# Patient Record
Sex: Female | Born: 1940 | Race: White | Hispanic: No | Marital: Married | Smoking: Never smoker
Health system: Southern US, Community
[De-identification: ages and names within clinical notes are randomized; demographics above are authoritative.]

## PROBLEM LIST (undated history)

## (undated) DIAGNOSIS — I10 Essential (primary) hypertension: Secondary | ICD-10-CM

## (undated) DIAGNOSIS — F419 Anxiety disorder, unspecified: Secondary | ICD-10-CM

## (undated) DIAGNOSIS — E079 Disorder of thyroid, unspecified: Secondary | ICD-10-CM

## (undated) HISTORY — PX: ABDOMINAL HYSTERECTOMY: SHX81

## (undated) HISTORY — PX: THYROIDECTOMY: SHX17

---

## 2009-03-02 ENCOUNTER — Ambulatory Visit: Payer: Self-pay | Admitting: Diagnostic Radiology

## 2009-03-02 ENCOUNTER — Ambulatory Visit (HOSPITAL_BASED_OUTPATIENT_CLINIC_OR_DEPARTMENT_OTHER): Admission: RE | Admit: 2009-03-02 | Discharge: 2009-03-02 | Payer: Self-pay | Admitting: Family Medicine

## 2009-03-18 ENCOUNTER — Ambulatory Visit (HOSPITAL_BASED_OUTPATIENT_CLINIC_OR_DEPARTMENT_OTHER): Admission: RE | Admit: 2009-03-18 | Discharge: 2009-03-18 | Payer: Self-pay | Admitting: Family Medicine

## 2009-03-18 ENCOUNTER — Ambulatory Visit: Payer: Self-pay | Admitting: Radiology

## 2009-04-15 ENCOUNTER — Ambulatory Visit (HOSPITAL_BASED_OUTPATIENT_CLINIC_OR_DEPARTMENT_OTHER): Admission: RE | Admit: 2009-04-15 | Discharge: 2009-04-15 | Payer: Self-pay | Admitting: Family Medicine

## 2009-04-15 ENCOUNTER — Ambulatory Visit: Payer: Self-pay | Admitting: Diagnostic Radiology

## 2014-08-27 DIAGNOSIS — Z79899 Other long term (current) drug therapy: Secondary | ICD-10-CM | POA: Insufficient documentation

## 2014-08-27 DIAGNOSIS — G4700 Insomnia, unspecified: Secondary | ICD-10-CM | POA: Insufficient documentation

## 2014-08-27 DIAGNOSIS — E785 Hyperlipidemia, unspecified: Secondary | ICD-10-CM | POA: Insufficient documentation

## 2014-08-27 DIAGNOSIS — M79604 Pain in right leg: Secondary | ICD-10-CM | POA: Insufficient documentation

## 2014-08-27 DIAGNOSIS — F339 Major depressive disorder, recurrent, unspecified: Secondary | ICD-10-CM | POA: Insufficient documentation

## 2014-08-27 DIAGNOSIS — F32A Depression, unspecified: Secondary | ICD-10-CM | POA: Insufficient documentation

## 2014-08-27 DIAGNOSIS — E782 Mixed hyperlipidemia: Secondary | ICD-10-CM | POA: Insufficient documentation

## 2015-03-10 DIAGNOSIS — R55 Syncope and collapse: Secondary | ICD-10-CM | POA: Insufficient documentation

## 2015-03-10 DIAGNOSIS — I4510 Unspecified right bundle-branch block: Secondary | ICD-10-CM | POA: Insufficient documentation

## 2015-10-06 DIAGNOSIS — Z23 Encounter for immunization: Secondary | ICD-10-CM | POA: Insufficient documentation

## 2016-06-14 DIAGNOSIS — Z08 Encounter for follow-up examination after completed treatment for malignant neoplasm: Secondary | ICD-10-CM | POA: Insufficient documentation

## 2016-06-14 DIAGNOSIS — R32 Unspecified urinary incontinence: Secondary | ICD-10-CM | POA: Insufficient documentation

## 2019-06-09 DIAGNOSIS — J301 Allergic rhinitis due to pollen: Secondary | ICD-10-CM | POA: Insufficient documentation

## 2019-07-28 ENCOUNTER — Emergency Department (HOSPITAL_BASED_OUTPATIENT_CLINIC_OR_DEPARTMENT_OTHER): Payer: Medicare Other

## 2019-07-28 ENCOUNTER — Inpatient Hospital Stay (HOSPITAL_BASED_OUTPATIENT_CLINIC_OR_DEPARTMENT_OTHER): Payer: Medicare Other

## 2019-07-28 ENCOUNTER — Other Ambulatory Visit: Payer: Self-pay

## 2019-07-28 ENCOUNTER — Encounter (HOSPITAL_BASED_OUTPATIENT_CLINIC_OR_DEPARTMENT_OTHER): Payer: Self-pay | Admitting: *Deleted

## 2019-07-28 ENCOUNTER — Inpatient Hospital Stay (HOSPITAL_BASED_OUTPATIENT_CLINIC_OR_DEPARTMENT_OTHER)
Admission: EM | Admit: 2019-07-28 | Discharge: 2019-07-31 | DRG: 351 | Disposition: A | Discharge status: Home / Self Care | Payer: Medicare Other | Attending: Family Medicine | Admitting: Family Medicine

## 2019-07-28 DIAGNOSIS — F329 Major depressive disorder, single episode, unspecified: Secondary | ICD-10-CM | POA: Diagnosis not present

## 2019-07-28 DIAGNOSIS — N3289 Other specified disorders of bladder: Secondary | ICD-10-CM

## 2019-07-28 DIAGNOSIS — Z79890 Hormone replacement therapy (postmenopausal): Secondary | ICD-10-CM | POA: Diagnosis not present

## 2019-07-28 DIAGNOSIS — K4090 Unilateral inguinal hernia, without obstruction or gangrene, not specified as recurrent: Secondary | ICD-10-CM | POA: Diagnosis present

## 2019-07-28 DIAGNOSIS — E785 Hyperlipidemia, unspecified: Secondary | ICD-10-CM | POA: Diagnosis not present

## 2019-07-28 DIAGNOSIS — E890 Postprocedural hypothyroidism: Secondary | ICD-10-CM | POA: Diagnosis present

## 2019-07-28 DIAGNOSIS — Z8249 Family history of ischemic heart disease and other diseases of the circulatory system: Secondary | ICD-10-CM | POA: Diagnosis not present

## 2019-07-28 DIAGNOSIS — Z90710 Acquired absence of both cervix and uterus: Secondary | ICD-10-CM | POA: Diagnosis not present

## 2019-07-28 DIAGNOSIS — K56609 Unspecified intestinal obstruction, unspecified as to partial versus complete obstruction: Secondary | ICD-10-CM | POA: Diagnosis present

## 2019-07-28 DIAGNOSIS — D72829 Elevated white blood cell count, unspecified: Secondary | ICD-10-CM

## 2019-07-28 DIAGNOSIS — N1330 Unspecified hydronephrosis: Secondary | ICD-10-CM | POA: Diagnosis present

## 2019-07-28 DIAGNOSIS — K4190 Unilateral femoral hernia, without obstruction or gangrene, not specified as recurrent: Secondary | ICD-10-CM | POA: Diagnosis present

## 2019-07-28 DIAGNOSIS — Z681 Body mass index (BMI) 19 or less, adult: Secondary | ICD-10-CM

## 2019-07-28 DIAGNOSIS — E440 Moderate protein-calorie malnutrition: Secondary | ICD-10-CM | POA: Insufficient documentation

## 2019-07-28 DIAGNOSIS — E039 Hypothyroidism, unspecified: Secondary | ICD-10-CM | POA: Diagnosis present

## 2019-07-28 DIAGNOSIS — Z20828 Contact with and (suspected) exposure to other viral communicable diseases: Secondary | ICD-10-CM | POA: Diagnosis present

## 2019-07-28 DIAGNOSIS — E876 Hypokalemia: Secondary | ICD-10-CM | POA: Diagnosis not present

## 2019-07-28 DIAGNOSIS — I10 Essential (primary) hypertension: Secondary | ICD-10-CM | POA: Diagnosis present

## 2019-07-28 DIAGNOSIS — Z79899 Other long term (current) drug therapy: Secondary | ICD-10-CM

## 2019-07-28 DIAGNOSIS — F419 Anxiety disorder, unspecified: Secondary | ICD-10-CM | POA: Diagnosis present

## 2019-07-28 DIAGNOSIS — K4130 Unilateral femoral hernia, with obstruction, without gangrene, not specified as recurrent: Principal | ICD-10-CM | POA: Diagnosis present

## 2019-07-28 HISTORY — DX: Anxiety disorder, unspecified: F41.9

## 2019-07-28 HISTORY — DX: Essential (primary) hypertension: I10

## 2019-07-28 HISTORY — DX: Disorder of thyroid, unspecified: E07.9

## 2019-07-28 LAB — COMPREHENSIVE METABOLIC PANEL
ALT: 21 U/L (ref 0–44)
AST: 30 U/L (ref 15–41)
Albumin: 4.4 g/dL (ref 3.5–5.0)
Alkaline Phosphatase: 103 U/L (ref 38–126)
Anion gap: 13 (ref 5–15)
BUN: 21 mg/dL (ref 8–23)
CO2: 27 mmol/L (ref 22–32)
Calcium: 9.9 mg/dL (ref 8.9–10.3)
Chloride: 96 mmol/L — ABNORMAL LOW (ref 98–111)
Creatinine, Ser: 0.68 mg/dL (ref 0.44–1.00)
GFR calc Af Amer: >60 mL/min (ref >60)
GFR calc non Af Amer: >60 mL/min (ref >60)
Glucose, Bld: 129 mg/dL — ABNORMAL HIGH (ref 70–99)
Potassium: 4.3 mmol/L (ref 3.5–5.1)
Sodium: 136 mmol/L (ref 135–145)
Total Bilirubin: 0.9 mg/dL (ref 0.3–1.2)
Total Protein: 8.5 g/dL — ABNORMAL HIGH (ref 6.5–8.1)

## 2019-07-28 LAB — CBC
HCT: 46.6 % — ABNORMAL HIGH (ref 36.0–46.0)
Hemoglobin: 15.2 g/dL — ABNORMAL HIGH (ref 12.0–15.0)
MCH: 29.6 pg (ref 26.0–34.0)
MCHC: 32.6 g/dL (ref 30.0–36.0)
MCV: 90.7 fL (ref 80.0–100.0)
Platelets: 298 10*3/uL (ref 150–400)
RBC: 5.14 MIL/uL — ABNORMAL HIGH (ref 3.87–5.11)
RDW: 13.2 % (ref 11.5–15.5)
WBC: 11.8 10*3/uL — ABNORMAL HIGH (ref 4.0–10.5)
nRBC: 0 % (ref 0.0–0.2)

## 2019-07-28 LAB — LIPASE, BLOOD: Lipase: 34 U/L (ref 11–51)

## 2019-07-28 MED ORDER — MORPHINE SULFATE (PF) 4 MG/ML IV SOLN
4.0000 mg | Freq: Once | INTRAVENOUS | Status: AC
  Administered 2019-07-28: 4 mg via INTRAVENOUS
  Filled 2019-07-28: qty 1

## 2019-07-28 MED ORDER — BENZOCAINE 20 % MT AERO
INHALATION_SPRAY | Freq: Once | OROMUCOSAL | Status: AC
  Administered 2019-07-28: via OROMUCOSAL

## 2019-07-28 MED ORDER — SODIUM CHLORIDE 0.9% FLUSH
3.0000 mL | Freq: Once | INTRAVENOUS | Status: DC
  Filled 2019-07-28: qty 3

## 2019-07-28 MED ORDER — IOHEXOL 300 MG/ML  SOLN
100.0000 mL | Freq: Once | INTRAMUSCULAR | Status: AC | PRN
  Administered 2019-07-28: 100 mL via INTRAVENOUS

## 2019-07-28 MED ORDER — SODIUM CHLORIDE 0.9 % IV BOLUS
1000.0000 mL | Freq: Once | INTRAVENOUS | Status: AC
  Administered 2019-07-28: 1000 mL via INTRAVENOUS

## 2019-07-28 MED ORDER — BENZOCAINE 20 % MT AERO
INHALATION_SPRAY | OROMUCOSAL | Status: AC
  Filled 2019-07-28: qty 57

## 2019-07-28 MED ORDER — LIDOCAINE VISCOUS HCL 2 % MT SOLN
15.0000 mL | Freq: Once | OROMUCOSAL | Status: AC
  Administered 2019-07-28: 15 mL via OROMUCOSAL
  Filled 2019-07-28: qty 15

## 2019-07-28 MED ORDER — ONDANSETRON 4 MG PO TBDP
4.0000 mg | ORAL_TABLET | Freq: Once | ORAL | Status: AC
  Administered 2019-07-28: 4 mg via ORAL
  Filled 2019-07-28: qty 1

## 2019-07-28 MED ORDER — ONDANSETRON HCL 4 MG/2ML IJ SOLN
4.0000 mg | Freq: Once | INTRAMUSCULAR | Status: AC
  Administered 2019-07-28: 4 mg via INTRAVENOUS
  Filled 2019-07-28: qty 2

## 2019-07-28 NOTE — ED Notes (Signed)
[facility] notified (Leslie Fuller) - patient ready for transport 

## 2019-07-28 NOTE — ED Triage Notes (Signed)
Vomiting since yesterday. Gas. Constipation.

## 2019-07-28 NOTE — ED Provider Notes (Signed)
[facility] EMERGENCY DEPARTMENT Provider Note   CSN: [id number] Arrival date & time: 07/28/19  1638     History   Chief Complaint Chief Complaint  Patient presents with  . Emesis    HPI Leslie Fuller is a 78 y.o. female past medical history of hypertension who presents for evaluation of abdominal pain, constipation, vomiting.  She states that she intermittently has issues with constipation which occasionally causes some abdominal pain.  Usually she takes MiraLAX and it improves.  She reports that for the past couple of days, she has had some generalized abdominal pain and constipation.  She is still able to pass flatus.  She states that yesterday, she started vomiting.  No blood noted in emesis.  She states that she has continued to vomit throughout today and has not been able to tolerate any p.o.  Additionally, she feels like her abdominal pain is worsening.  She attempted to take MiraLAX to see if that would help but had no improvement in her symptoms.  She does state that she ate crab salad yesterday.  She states that her husband ate the same thing and did not have any symptoms.  She has not had any fevers, chest pain, difficulty breathing, dysuria, hematuria.  She denies any history of abdominal surgeries.  She denies any travel or known COVID-19 exposure.     The history is provided by the patient. A language interpreter was used.    Past Medical History:  Diagnosis Date  . Anxiety   . Hypertension   . Thyroid disease     Patient Active Problem List   Diagnosis Date Noted  . Leukocytosis 07/29/2019  . Bladder distention 07/29/2019  . Femoral hernia of right side 07/29/2019  . Incarcerated femoral hernia 07/29/2019  . Essential hypertension 07/29/2019  . Hypothyroidism 07/29/2019  . SBO (small bowel obstruction) (HCC) 07/28/2019    Past Surgical History:  Procedure Laterality Date  . ABDOMINAL HYSTERECTOMY    . THYROIDECTOMY       OB History   No  obstetric history on file.      Home Medications    Prior to Admission medications   Medication Sig Start Date End Date Taking? Authorizing Provider  calcium-vitamin D (OSCAL WITH D) 500-200 MG-UNIT tablet Take 1 tablet by mouth daily with breakfast.   Yes [provider]  diltiazem (CARDIZEM CD) 240 MG 24 hr capsule Take by mouth. 11/22/16 09/03/19 Yes [provider]  levothyroxine (SYNTHROID) 50 MCG tablet Take 50 mcg by mouth See admin instructions. Take one tablet daily except for Sundays. 06/16/17 09/03/19 Yes [provider]  LORazepam (ATIVAN) 1 MG tablet Take 1 mg by mouth at bedtime.  06/05/19 06/05/20 Yes [provider]  pravastatin (PRAVACHOL) 10 MG tablet Take 10 mg by mouth at bedtime.  06/13/17 09/03/19 Yes [provider]  sertraline (ZOLOFT) 100 MG tablet Take 150 mg by mouth daily.  06/16/17  Yes [provider]  ZINC SULFATE PO Take 2 capsules by mouth daily.   Yes [provider]    Family History Family History  Problem Relation Age of Onset  . Heart attack Father     Social History Social History   Tobacco Use  . Smoking status: Never Smoker  . Smokeless tobacco: Never Used  Substance Use Topics  . Alcohol use: Never    Frequency: Never  . Drug use: Never     Allergies   Patient has no known allergies.   Review  of Systems Review of Systems  Constitutional: Negative for fever.  Respiratory: Negative for cough and shortness of breath.   Cardiovascular: Negative for chest pain.  Gastrointestinal: Positive for abdominal pain, constipation, nausea and vomiting.  Genitourinary: Negative for dysuria and hematuria.  Neurological: Negative for headaches.  All other systems reviewed and are negative.    Physical Exam Updated Vital Signs BP (!) 156/87 (BP Location: Right Arm)   Pulse 82   Temp 98.6 F (37 C) (Oral)   Resp 16   Ht 5' (1.524 m)   Wt 36.8 kg   SpO2 96%   BMI 15.84 kg/m    Physical Exam Vitals signs and nursing note reviewed.  Constitutional:      Appearance: Normal appearance. She is well-developed.  HENT:     Head: Normocephalic and atraumatic.  Eyes:     General: Lids are normal.     Conjunctiva/sclera: Conjunctivae normal.     Pupils: Pupils are equal, round, and reactive to light.  Neck:     Musculoskeletal: Full passive range of motion without pain.  Cardiovascular:     Rate and Rhythm: Normal rate and regular rhythm.     Pulses: Normal pulses.          Radial pulses are 2+ on the right side and 2+ on the left side.       Dorsalis pedis pulses are 2+ on the right side and 2+ on the left side.     Heart sounds: Normal heart sounds. No murmur. No friction rub. No gallop.   Pulmonary:     Effort: Pulmonary effort is normal.     Breath sounds: Normal breath sounds.     Comments: Lungs clear to auscultation bilaterally.  Symmetric chest rise.  No wheezing, rales, rhonchi. Abdominal:     General: Bowel sounds are decreased. There is distension.     Palpations: Abdomen is soft. Abdomen is not rigid.     Tenderness: There is generalized abdominal tenderness. There is no guarding.     Comments: Abdomen with slight abdominal distention.  Generalized tenderness with no focal point. No rigidity, No guarding. No peritoneal signs.  No CVA tenderness noted bilaterally.  Musculoskeletal: Normal range of motion.  Skin:    General: Skin is warm and dry.     Capillary Refill: Capillary refill takes less than 2 seconds.  Neurological:     Mental Status: She is alert and oriented to person, place, and time.  Psychiatric:        Speech: Speech normal.      ED Treatments / Results  Labs (all labs ordered are listed, but only abnormal results are displayed) Labs Reviewed  COMPREHENSIVE METABOLIC PANEL - Abnormal; Notable for the following components:      Result Value   Chloride 96 (*)    Glucose, Bld 129 (*)    Total Protein 8.5 (*)    All other  components within normal limits  CBC - Abnormal; Notable for the following components:   WBC 11.8 (*)    RBC 5.14 (*)    Hemoglobin 15.2 (*)    HCT 46.6 (*)    All other components within normal limits  URINALYSIS, ROUTINE W REFLEX MICROSCOPIC - Abnormal; Notable for the following components:   Specific Gravity, Urine 1.031 (*)    Hgb urine dipstick MODERATE (*)    Ketones, ur 5 (*)    All other components within normal limits  BASIC METABOLIC PANEL - Abnormal; Notable for the  following components:   Glucose, Bld 100 (*)    Calcium 8.7 (*)    All other components within normal limits  SARS CORONAVIRUS 2 (TAT 6-12 HRS)  URINE CULTURE  LIPASE, BLOOD  CBC  TYPE AND SCREEN  ABO/RH    EKG None  Radiology Dg Abdomen 1 View  Result Date: 07/28/2019 CLINICAL DATA:  NG tube placement EXAM: ABDOMEN - 1 VIEW COMPARISON:  CT earlier today FINDINGS: NG tube coils in the fundus of the stomach. There is an elongated stomach with moderate gaseous distention. Dilated small bowel loops compatible with small bowel obstruction. No free air or organomegaly. IMPRESSION: NG tube tip coils in the fundus of the stomach. Dilated, elongated stomach. Electronically Signed   By: Charlett NoseKevin  Dover M.D.   On: 07/28/2019 23:20   Ct Abdomen Pelvis W Contrast  Result Date: 07/28/2019 CLINICAL DATA:  Abdominal pain with vomiting EXAM: CT ABDOMEN AND PELVIS WITH CONTRAST TECHNIQUE: Multidetector CT imaging of the abdomen and pelvis was performed using the standard protocol following bolus administration of intravenous contrast. CONTRAST:  100mL OMNIPAQUE IOHEXOL 300 MG/ML  SOLN COMPARISON:  None. FINDINGS: Lower chest: Lung bases demonstrate no acute consolidation or effusion. The heart size is within normal limits. Hepatobiliary: No focal hepatic abnormality. Calcified gallstones. No biliary dilatation Pancreas: Unremarkable. No pancreatic ductal dilatation or surrounding inflammatory changes. Spleen: Normal in size  without focal abnormality. Adrenals/Urinary Tract: Adrenal glands are normal. Cysts within the bilateral kidneys. Mild bilateral hydronephrosis and proximal hydroureter. Bladder is markedly distended. Stomach/Bowel: Stomach is moderately enlarged. Multiple dilated loops of mid to distal small bowel measuring up to 3.3 cm in diameter. Transition point appears to be related to a right groin hernia, suspected to represent a femoral hernia. There is mild fluid and stranding about the right groin hernia. There is no colon wall thickening Vascular/Lymphatic: Moderate aortic atherosclerosis. No aneurysm. No significantly enlarged lymph nodes Reproductive: Status post hysterectomy. No adnexal masses. Other: No free air. Small volume of ascites surrounding the liver and within the bilateral colic gutters. Musculoskeletal: Degenerative changes of the spine. No acute osseous abnormality. Probable nerve root cysts at the sacrum. Fluid density right presacral structure appears to be contiguous with right lower sacral foramen. IMPRESSION: 1. Findings consistent with mechanical small bowel obstruction, transition point appears to be related to a right groin hernia, location and configuration suggests possible femoral hernia. There is some fluid and edema within the hernia sac. 2. Small amount of ascites within the abdomen but negative for free air 3. Gallstones 4. Mild bilateral hydronephrosis and proximal hydroureter, probably secondary to over distention of the bladder Electronically Signed   By: Jasmine PangKim  Fujinaga M.D.   On: 07/28/2019 21:58    Procedures Procedures (including critical care time)  Medications Ordered in ED Medications  sodium chloride flush (NS) 0.9 % injection 3 mL (3 mLs Intravenous Not Given 07/28/19 1922)  heparin injection 5,000 Units (0 Units Subcutaneous Hold 07/29/19 0625)  acetaminophen (TYLENOL) tablet 650 mg (has no administration in time range)    Or  acetaminophen (TYLENOL) suppository 650 mg  (has no administration in time range)  ondansetron (ZOFRAN) injection 4 mg (has no administration in time range)  morphine 2 MG/ML injection 1 mg (1 mg Intravenous Given 07/29/19 0809)  Chlorhexidine Gluconate Cloth 2 % PADS 6 each (has no administration in time range)    And  Chlorhexidine Gluconate Cloth 2 % PADS 6 each (has no administration in time range)  0.9 %  sodium chloride  infusion ( Intravenous New Bag/Given 07/29/19 0815)  levothyroxine (SYNTHROID, LEVOTHROID) injection 25 mcg (has no administration in time range)  metoprolol tartrate (LOPRESSOR) injection 2.5 mg (2.5 mg Intravenous Given 07/29/19 0847)  LORazepam (ATIVAN) injection 0.5 mg (has no administration in time range)  cefoTEtan (CEFOTAN) 2 g in sodium chloride 0.9 % 100 mL IVPB (has no administration in time range)  ondansetron (ZOFRAN-ODT) disintegrating tablet 4 mg (4 mg Oral Given 07/28/19 1728)  sodium chloride 0.9 % bolus 1,000 mL ( Intravenous Stopped 07/28/19 2053)  ondansetron (ZOFRAN) injection 4 mg (4 mg Intravenous Given 07/28/19 1952)  morphine 4 MG/ML injection 4 mg (4 mg Intravenous Given 07/28/19 1952)  iohexol (OMNIPAQUE) 300 MG/ML solution 100 mL (100 mLs Intravenous Contrast Given 07/28/19 2121)  lidocaine (XYLOCAINE) 2 % viscous mouth solution 15 mL (15 mLs Mouth/Throat Given 07/28/19 2238)  Benzocaine (HURRCAINE) 20 % mouth spray ( Mouth/Throat Given 07/28/19 2330)  morphine 4 MG/ML injection 4 mg (4 mg Intravenous Given 07/29/19 0028)  propofol (DIPRIVAN) 10 mg/mL bolus/IV push (has no administration in time range)  fentaNYL (SUBLIMAZE) 100 MCG/2ML injection (has no administration in time range)  bupivacaine (PF) (MARCAINE) 0.25 % injection (has no administration in time range)  bupivacaine-epinephrine (MARCAINE W/ EPI) 0.25% -1:200000 injection (has no administration in time range)  lidocaine (PF) (XYLOCAINE) 1 % injection (has no administration in time range)     Initial Impression / Assessment and Plan /  ED Course  I have reviewed the triage vital signs and the nursing notes.  Pertinent labs & imaging results that were available during my care of the patient were reviewed by me and considered in my medical decision making (see chart for details).        [age] year old female who presents for evaluation of abdominal pain, vomiting, constipation.  Has not been able to tolerate p.o. since last night.  No fevers, chest pain. Patient is afebrile, non-toxic appearing, sitting comfortably on examination table. Vital signs reviewed and stable.  On exam, she has some generalized abdominal tenderness.  No focal point.  Concern for infectious process versus obstruction.  Plan for labs, imaging.  CBC shows slight leukocytosis of 11.8.  Hemoglobin is 15.2.  Lipase unremarkable.  CMP is unremarkable.  CT abdomen pelvis shows small bowel obstruction with transition point at the right groin hernia.  She also may be has a possible femoral hernia.  There is some fluid and edema noted within the hernia sac.  Small amount of amount of ascites but no evidence of free air.   Given small bowel obstruction, will plan to consult surgery.  Reevaluation.  Patient still reports some pain.  On my evaluation, she has generalized distention, more notably in the lower abd but cannot reduce any obvious hernia here in the ED. She has some diffuse distention that I feel like is secondary to obstruction.  Discussed patient with Dr. Gerrit FriendsGerkin (Gen Surg).  Agrees with plan for admission to hospital service and NG tube.  Will plan to consult on patient tomorrow.  Updated patient and daughter on plan. They are agreeable.   Discussed patient with Dr. Loney Lohathore (hospitalist) who accepts patient for admission.   Portions of this note were generated with Scientist, clinical (histocompatibility and immunogenetics)Dragon dictation software. Dictation errors may occur despite best attempts at proofreading.   Final Clinical Impressions(s) / ED Diagnoses   Final diagnoses:  Small bowel obstruction  [facility](HCC)    ED Discharge Orders    None       Maxwell CaulLayden, Lindsey A,  PA-C 07/29/19 1201    Charlesetta Shanks, MD 08/09/19 1820

## 2019-07-28 NOTE — ED Notes (Signed)
Patient transported to CT 

## 2019-07-29 ENCOUNTER — Encounter (HOSPITAL_COMMUNITY): Payer: Self-pay | Admitting: Internal Medicine

## 2019-07-29 ENCOUNTER — Inpatient Hospital Stay (HOSPITAL_COMMUNITY): Payer: Medicare Other | Admitting: Anesthesiology

## 2019-07-29 ENCOUNTER — Encounter (HOSPITAL_COMMUNITY)
Admission: EM | Disposition: A | Discharge status: Home / Self Care | Payer: Self-pay | Source: Home / Self Care | Attending: Family Medicine

## 2019-07-29 DIAGNOSIS — K4130 Unilateral femoral hernia, with obstruction, without gangrene, not specified as recurrent: Secondary | ICD-10-CM | POA: Diagnosis present

## 2019-07-29 DIAGNOSIS — N3289 Other specified disorders of bladder: Secondary | ICD-10-CM

## 2019-07-29 DIAGNOSIS — K56609 Unspecified intestinal obstruction, unspecified as to partial versus complete obstruction: Secondary | ICD-10-CM

## 2019-07-29 DIAGNOSIS — K4190 Unilateral femoral hernia, without obstruction or gangrene, not specified as recurrent: Secondary | ICD-10-CM | POA: Diagnosis present

## 2019-07-29 DIAGNOSIS — D72829 Elevated white blood cell count, unspecified: Secondary | ICD-10-CM

## 2019-07-29 DIAGNOSIS — E440 Moderate protein-calorie malnutrition: Secondary | ICD-10-CM | POA: Insufficient documentation

## 2019-07-29 DIAGNOSIS — E039 Hypothyroidism, unspecified: Secondary | ICD-10-CM | POA: Diagnosis present

## 2019-07-29 DIAGNOSIS — I10 Essential (primary) hypertension: Secondary | ICD-10-CM | POA: Diagnosis present

## 2019-07-29 HISTORY — PX: INGUINAL HERNIA REPAIR: SHX194

## 2019-07-29 LAB — URINALYSIS, ROUTINE W REFLEX MICROSCOPIC
Bacteria, UA: NONE SEEN
Bilirubin Urine: NEGATIVE
Glucose, UA: NEGATIVE mg/dL
Ketones, ur: 5 mg/dL — AB
Leukocytes,Ua: NEGATIVE
Nitrite: NEGATIVE
Protein, ur: NEGATIVE mg/dL
Specific Gravity, Urine: 1.031 — ABNORMAL HIGH (ref 1.005–1.030)
pH: 6 (ref 5.0–8.0)

## 2019-07-29 LAB — CBC
HCT: 42.4 % (ref 36.0–46.0)
Hemoglobin: 13.5 g/dL (ref 12.0–15.0)
MCH: 29.8 pg (ref 26.0–34.0)
MCHC: 31.8 g/dL (ref 30.0–36.0)
MCV: 93.6 fL (ref 80.0–100.0)
Platelets: 240 10*3/uL (ref 150–400)
RBC: 4.53 MIL/uL (ref 3.87–5.11)
RDW: 13.4 % (ref 11.5–15.5)
WBC: 8.3 10*3/uL (ref 4.0–10.5)
nRBC: 0 % (ref 0.0–0.2)

## 2019-07-29 LAB — BASIC METABOLIC PANEL
Anion gap: 8 (ref 5–15)
BUN: 20 mg/dL (ref 8–23)
CO2: 28 mmol/L (ref 22–32)
Calcium: 8.7 mg/dL — ABNORMAL LOW (ref 8.9–10.3)
Chloride: 106 mmol/L (ref 98–111)
Creatinine, Ser: 0.64 mg/dL (ref 0.44–1.00)
GFR calc Af Amer: >60 mL/min (ref >60)
GFR calc non Af Amer: >60 mL/min (ref >60)
Glucose, Bld: 100 mg/dL — ABNORMAL HIGH (ref 70–99)
Potassium: 3.8 mmol/L (ref 3.5–5.1)
Sodium: 142 mmol/L (ref 135–145)

## 2019-07-29 LAB — TYPE AND SCREEN
ABO/RH(D): O NEG
Antibody Screen: NEGATIVE

## 2019-07-29 LAB — ABO/RH: ABO/RH(D): O NEG

## 2019-07-29 LAB — SARS CORONAVIRUS 2 (TAT 6-24 HRS): SARS Coronavirus 2: NEGATIVE

## 2019-07-29 SURGERY — REPAIR, HERNIA, INGUINAL, ADULT
Anesthesia: General | Site: Groin | Laterality: Right

## 2019-07-29 MED ORDER — ONDANSETRON HCL 4 MG/2ML IJ SOLN: 4.0000 mg | Freq: Four times a day (QID) | INTRAMUSCULAR | Status: DC | PRN

## 2019-07-29 MED ORDER — BUPIVACAINE-EPINEPHRINE 0.25% -1:200000 IJ SOLN
INTRAMUSCULAR | Status: DC | PRN
  Administered 2019-07-29: 7 mL

## 2019-07-29 MED ORDER — MORPHINE SULFATE (PF) 2 MG/ML IV SOLN
1.0000 mg | INTRAVENOUS | Status: DC | PRN
  Administered 2019-07-29: 1 mg via INTRAVENOUS
  Filled 2019-07-29: qty 1

## 2019-07-29 MED ORDER — BUPIVACAINE HCL (PF) 0.25 % IJ SOLN
INTRAMUSCULAR | Status: AC
  Filled 2019-07-29: qty 30

## 2019-07-29 MED ORDER — MORPHINE SULFATE (PF) 4 MG/ML IV SOLN
4.0000 mg | Freq: Once | INTRAVENOUS | Status: AC
  Administered 2019-07-29: 4 mg via INTRAVENOUS
  Filled 2019-07-29: qty 1

## 2019-07-29 MED ORDER — FENTANYL CITRATE (PF) 100 MCG/2ML IJ SOLN
INTRAMUSCULAR | Status: DC | PRN
  Administered 2019-07-29: 50 ug via INTRAVENOUS
  Administered 2019-07-29: 25 ug via INTRAVENOUS
  Administered 2019-07-29: 50 ug via INTRAVENOUS

## 2019-07-29 MED ORDER — SUGAMMADEX SODIUM 200 MG/2ML IV SOLN
INTRAVENOUS | Status: DC | PRN
  Administered 2019-07-29 (×2): 100 mg via INTRAVENOUS

## 2019-07-29 MED ORDER — ACETAMINOPHEN 650 MG RE SUPP: 650.0000 mg | Freq: Four times a day (QID) | RECTAL | Status: DC | PRN

## 2019-07-29 MED ORDER — EPHEDRINE 5 MG/ML INJ
INTRAVENOUS | Status: AC
  Filled 2019-07-29: qty 10

## 2019-07-29 MED ORDER — PHENYLEPHRINE HCL (PRESSORS) 10 MG/ML IV SOLN
INTRAVENOUS | Status: DC | PRN
  Administered 2019-07-29 (×2): 80 ug via INTRAVENOUS

## 2019-07-29 MED ORDER — 0.9 % SODIUM CHLORIDE (POUR BTL) OPTIME
TOPICAL | Status: DC | PRN
  Administered 2019-07-29: 1000 mL

## 2019-07-29 MED ORDER — ONDANSETRON HCL 4 MG/2ML IJ SOLN
INTRAMUSCULAR | Status: DC | PRN
  Administered 2019-07-29: 4 mg via INTRAVENOUS

## 2019-07-29 MED ORDER — EPHEDRINE SULFATE 50 MG/ML IJ SOLN
INTRAMUSCULAR | Status: DC | PRN
  Administered 2019-07-29: 10 mg via INTRAVENOUS

## 2019-07-29 MED ORDER — MORPHINE SULFATE (PF) 2 MG/ML IV SOLN
1.0000 mg | INTRAVENOUS | Status: DC | PRN
  Administered 2019-07-29 (×2): 1 mg via INTRAVENOUS
  Filled 2019-07-29 (×2): qty 1

## 2019-07-29 MED ORDER — BUPIVACAINE-EPINEPHRINE (PF) 0.25% -1:200000 IJ SOLN
INTRAMUSCULAR | Status: AC
  Filled 2019-07-29: qty 30

## 2019-07-29 MED ORDER — CHLORHEXIDINE GLUCONATE CLOTH 2 % EX PADS: 6.0000 | MEDICATED_PAD | Freq: Once | CUTANEOUS | Status: DC

## 2019-07-29 MED ORDER — HYDROMORPHONE HCL 1 MG/ML IJ SOLN
INTRAMUSCULAR | Status: AC
  Filled 2019-07-29: qty 2

## 2019-07-29 MED ORDER — LACTATED RINGERS IV SOLN
INTRAVENOUS | Status: DC
  Administered 2019-07-29 (×2): via INTRAVENOUS

## 2019-07-29 MED ORDER — HEPARIN SODIUM (PORCINE) 5000 UNIT/ML IJ SOLN
5000.0000 [IU] | Freq: Three times a day (TID) | INTRAMUSCULAR | Status: DC
  Administered 2019-07-29 – 2019-07-31 (×6): 5000 [IU] via SUBCUTANEOUS
  Filled 2019-07-29 (×7): qty 1

## 2019-07-29 MED ORDER — LORAZEPAM 2 MG/ML IJ SOLN: 0.5000 mg | Freq: Two times a day (BID) | INTRAMUSCULAR | Status: DC | PRN

## 2019-07-29 MED ORDER — SODIUM CHLORIDE 0.9 % IV SOLN
INTRAVENOUS | Status: DC
  Administered 2019-07-29: 02:00:00 via INTRAVENOUS

## 2019-07-29 MED ORDER — FENTANYL CITRATE (PF) 100 MCG/2ML IJ SOLN
INTRAMUSCULAR | Status: AC
  Filled 2019-07-29: qty 2

## 2019-07-29 MED ORDER — HYDROMORPHONE HCL 1 MG/ML IJ SOLN
0.2500 mg | INTRAMUSCULAR | Status: DC | PRN
  Administered 2019-07-29 (×3): 0.5 mg via INTRAVENOUS

## 2019-07-29 MED ORDER — PROPOFOL 10 MG/ML IV BOLUS
INTRAVENOUS | Status: DC | PRN
  Administered 2019-07-29: 130 mg via INTRAVENOUS

## 2019-07-29 MED ORDER — ONDANSETRON HCL 4 MG/2ML IJ SOLN: 4.0000 mg | Freq: Once | INTRAMUSCULAR | Status: DC | PRN

## 2019-07-29 MED ORDER — PHENYLEPHRINE 40 MCG/ML (10ML) SYRINGE FOR IV PUSH (FOR BLOOD PRESSURE SUPPORT)
PREFILLED_SYRINGE | INTRAVENOUS | Status: AC
  Filled 2019-07-29: qty 10

## 2019-07-29 MED ORDER — ROCURONIUM BROMIDE 10 MG/ML (PF) SYRINGE
PREFILLED_SYRINGE | INTRAVENOUS | Status: DC | PRN
  Administered 2019-07-29: 10 mg via INTRAVENOUS
  Administered 2019-07-29: 5 mg via INTRAVENOUS
  Administered 2019-07-29: 30 mg via INTRAVENOUS

## 2019-07-29 MED ORDER — SUCCINYLCHOLINE CHLORIDE 200 MG/10ML IV SOSY
PREFILLED_SYRINGE | INTRAVENOUS | Status: DC | PRN
  Administered 2019-07-29: 120 mg via INTRAVENOUS

## 2019-07-29 MED ORDER — SODIUM CHLORIDE 0.9 % IV SOLN
2.0000 g | INTRAVENOUS | Status: AC
  Administered 2019-07-29: 2 g via INTRAVENOUS
  Filled 2019-07-29: qty 2

## 2019-07-29 MED ORDER — LEVOTHYROXINE SODIUM 100 MCG/5ML IV SOLN
25.0000 ug | Freq: Every day | INTRAVENOUS | Status: DC
  Administered 2019-07-30 – 2019-07-31 (×2): 25 ug via INTRAVENOUS
  Filled 2019-07-29 (×3): qty 5

## 2019-07-29 MED ORDER — MEPERIDINE HCL 50 MG/ML IJ SOLN: 6.2500 mg | INTRAMUSCULAR | Status: DC | PRN

## 2019-07-29 MED ORDER — HYDROCODONE-ACETAMINOPHEN 5-325 MG PO TABS: 1.0000 | ORAL_TABLET | ORAL | Status: DC | PRN

## 2019-07-29 MED ORDER — LIDOCAINE HCL (PF) 1 % IJ SOLN
INTRAMUSCULAR | Status: AC
  Filled 2019-07-29: qty 30

## 2019-07-29 MED ORDER — SODIUM CHLORIDE 0.9 % IV SOLN
1.0000 g | INTRAVENOUS | Status: AC
  Administered 2019-07-29: 16:00:00 1 g via INTRAVENOUS
  Filled 2019-07-29 (×2): qty 1

## 2019-07-29 MED ORDER — METOPROLOL TARTRATE 5 MG/5ML IV SOLN
2.5000 mg | Freq: Three times a day (TID) | INTRAVENOUS | Status: DC
  Administered 2019-07-29 – 2019-07-31 (×7): 2.5 mg via INTRAVENOUS
  Filled 2019-07-29 (×7): qty 5

## 2019-07-29 MED ORDER — SODIUM CHLORIDE 0.9 % IV SOLN
INTRAVENOUS | Status: DC
  Administered 2019-07-29: 08:00:00 via INTRAVENOUS

## 2019-07-29 MED ORDER — ACETAMINOPHEN 325 MG PO TABS
650.0000 mg | ORAL_TABLET | Freq: Four times a day (QID) | ORAL | Status: DC | PRN
  Administered 2019-07-30: 10:00:00 650 mg via ORAL
  Filled 2019-07-29: qty 2

## 2019-07-29 MED ORDER — PROPOFOL 10 MG/ML IV BOLUS
INTRAVENOUS | Status: AC
  Filled 2019-07-29: qty 40

## 2019-07-29 SURGICAL SUPPLY — 41 items
BENZOIN TINCTURE PRP APPL 2/3 (GAUZE/BANDAGES/DRESSINGS) ×2 IMPLANT
BLADE HEX COATED 2.75 (ELECTRODE) ×2 IMPLANT
BLADE SURG 15 STRL LF DISP TIS (BLADE) ×1 IMPLANT
BLADE SURG 15 STRL SS (BLADE) ×1
COVER WAND RF STERILE (DRAPES) IMPLANT
DECANTER SPIKE VIAL GLASS SM (MISCELLANEOUS) ×2 IMPLANT
DERMABOND ADVANCED (GAUZE/BANDAGES/DRESSINGS)
DERMABOND ADVANCED .7 DNX12 (GAUZE/BANDAGES/DRESSINGS) IMPLANT
DRAIN PENROSE 18X1/2 LTX STRL (DRAIN) ×1 IMPLANT
DRAPE LAPAROSCOPIC ABDOMINAL (DRAPES) ×2 IMPLANT
ELECT PENCIL ROCKER SW 15FT (MISCELLANEOUS) ×2 IMPLANT
ELECT REM PT RETURN 15FT ADLT (MISCELLANEOUS) ×2 IMPLANT
GAUZE SPONGE 4X4 12PLY STRL (GAUZE/BANDAGES/DRESSINGS) ×2 IMPLANT
GLOVE BIO SURGEON STRL SZ7.5 (GLOVE) ×4 IMPLANT
GLOVE BIOGEL PI IND STRL 7.0 (GLOVE) ×1 IMPLANT
GLOVE BIOGEL PI INDICATOR 7.0 (GLOVE) ×1
GOWN STRL REUS W/ TWL XL LVL3 (GOWN DISPOSABLE) ×1 IMPLANT
GOWN STRL REUS W/TWL LRG LVL3 (GOWN DISPOSABLE) ×2 IMPLANT
GOWN STRL REUS W/TWL XL LVL3 (GOWN DISPOSABLE) ×3 IMPLANT
KIT BASIN OR (CUSTOM PROCEDURE TRAY) ×2 IMPLANT
KIT TURNOVER KIT A (KITS) ×1 IMPLANT
MESH HERNIA SYS ULTRAPRO LRG (Mesh General) ×1 IMPLANT
NDL HYPO 25X1 1.5 SAFETY (NEEDLE) ×1 IMPLANT
NEEDLE HYPO 25X1 1.5 SAFETY (NEEDLE) ×2 IMPLANT
NS IRRIG 1000ML POUR BTL (IV SOLUTION) ×2 IMPLANT
PACK BASIC VI WITH GOWN DISP (CUSTOM PROCEDURE TRAY) ×2 IMPLANT
SPONGE LAP 18X18 RF (DISPOSABLE) ×2 IMPLANT
STRIP CLOSURE SKIN 1/2X4 (GAUZE/BANDAGES/DRESSINGS) ×2 IMPLANT
SUT MNCRL AB 4-0 PS2 18 (SUTURE) ×2 IMPLANT
SUT PROLENE 2 0 SH DA (SUTURE) ×8 IMPLANT
SUT SILK 2 0 SH (SUTURE) IMPLANT
SUT SILK 3 0 (SUTURE)
SUT SILK 3-0 18XBRD TIE 12 (SUTURE) IMPLANT
SUT VIC AB 2-0 SH 27 (SUTURE) ×2
SUT VIC AB 2-0 SH 27X BRD (SUTURE) ×2 IMPLANT
SUT VIC AB 3-0 SH 27 (SUTURE) ×2
SUT VIC AB 3-0 SH 27XBRD (SUTURE) ×2 IMPLANT
SYR BULB IRRIGATION 50ML (SYRINGE) ×1 IMPLANT
SYR CONTROL 10ML LL (SYRINGE) ×2 IMPLANT
TOWEL OR 17X26 10 PK STRL BLUE (TOWEL DISPOSABLE) ×2 IMPLANT
YANKAUER SUCT BULB TIP 10FT TU (MISCELLANEOUS) ×2 IMPLANT

## 2019-07-29 NOTE — Anesthesia Postprocedure Evaluation (Signed)
Anesthesia Post Note  Patient: Leslie Fuller  Procedure(s) Performed: RIGHT FEMORAL HERNIA REPAIR WITH MESH (Right Groin)     Patient location during evaluation: PACU Anesthesia Type: General Level of consciousness: awake and alert Pain management: pain level controlled Vital Signs Assessment: post-procedure vital signs reviewed and stable Respiratory status: spontaneous breathing, nonlabored ventilation, respiratory function stable and patient connected to nasal cannula oxygen Cardiovascular status: blood pressure returned to baseline and stable Postop Assessment: no apparent nausea or vomiting Anesthetic complications: no    Last Vitals:  Vitals:   07/29/19 1649 07/29/19 1700  BP: (!) 168/77 (!) 163/81  Pulse: 82 82  Resp: 14 16  Temp: 36.9 C   SpO2: 100% 96%    Last Pain:  Vitals:   07/29/19 1705  TempSrc:   PainSc: Wilson DAVID

## 2019-07-29 NOTE — Consult Note (Signed)
General Surgery [facility]  Reason for Consult: small bowel obstruction, right femoral hernia  Referring Physician:  Dr. Stark Jock, [facility]  Leslie Fuller is an 78 y.o. female.   HPI: Patient is a 78 year old female admitted to the medical service with signs and symptoms of small bowel obstruction.  The patient's daughter is at the bedside.  The patient does speak English but the daughter acts as a Optometrist when needed.  Patient has been having intermittent symptoms of abdominal discomfort, constipation, diarrhea, and abdominal pain.  This episode began on July 27, 2019.  Patient had progressive symptoms with abdominal discomfort followed by nausea and vomiting.  Patient presented to [place] for evaluation.  CT scan of abdomen and pelvis demonstrated findings of a right femoral hernia with associated small bowel obstruction.  Patient was transferred to [facility] for admission to the medical service and general surgery was asked to consult.  Past surgical history notable for abdominal hysterectomy.  Past Medical History:  Diagnosis Date  . Anxiety   . Hypertension   . Thyroid disease     Past Surgical History:  Procedure Laterality Date  . ABDOMINAL HYSTERECTOMY    . THYROIDECTOMY      Family History  Problem Relation Age of Onset  . Heart attack Father     Social History:  reports that she has never smoked. She has never used smokeless tobacco. She reports that she does not drink alcohol or use drugs.  Allergies: No Known Allergies  Medications: I have reviewed the patient's current medications.  Results for orders placed or performed during the hospital encounter of 07/28/19 (from the past 48 hour(s))  Lipase, blood     Status: None   Collection Time: 07/28/19  7:48 PM  Result Value Ref Range   Lipase 34 11 - 51 U/L    Comment: Performed at [facility], [address] [place], [place] [address]  Comprehensive  metabolic panel     Status: Abnormal   Collection Time: 07/28/19  7:48 PM  Result Value Ref Range   Sodium 136 135 - 145 mmol/L   Potassium 4.3 3.5 - 5.1 mmol/L   Chloride 96 (L) 98 - 111 mmol/L   CO2 27 22 - 32 mmol/L   Glucose, Bld 129 (H) 70 - 99 mg/dL   BUN 21 8 - 23 mg/dL   Creatinine, Ser 0.68 0.44 - 1.00 mg/dL   Calcium 9.9 8.9 - 10.3 mg/dL   Total Protein 8.5 (H) 6.5 - 8.1 g/dL   Albumin 4.4 3.5 - 5.0 g/dL   AST 30 15 - 41 U/L   ALT 21 0 - 44 U/L   Alkaline Phosphatase 103 38 - 126 U/L   Total Bilirubin 0.9 0.3 - 1.2 mg/dL   GFR calc non Af Amer >60 >60 mL/min   GFR calc Af Amer >60 >60 mL/min   Anion gap 13 5 - 15    Comment: Performed at [facility], [address] [place], [place] [address]  CBC     Status: Abnormal   Collection Time: 07/28/19  7:48 PM  Result Value Ref Range   WBC 11.8 (H) 4.0 - 10.5 K/uL   RBC 5.14 (H) 3.87 - 5.11 MIL/uL   Hemoglobin 15.2 (H) 12.0 - 15.0 g/dL   HCT 46.6 (H) 36.0 - 46.0 %   MCV 90.7 80.0 - 100.0 fL   MCH 29.6 26.0 - 34.0 pg  MCHC 32.6 30.0 - 36.0 g/dL   RDW 45.413.2 09.811.5 - 11.915.5 %   Platelets 298 150 - 400 K/uL   nRBC 0.0 0.0 - 0.2 %    Comment: Performed at [facility], [address] [place], [place] [address]  CBC     Status: None   Collection Time: 07/29/19  3:22 AM  Result Value Ref Range   WBC 8.3 4.0 - 10.5 K/uL   RBC 4.53 3.87 - 5.11 MIL/uL   Hemoglobin 13.5 12.0 - 15.0 g/dL   HCT 95.642.4 21.336.0 - 08.646.0 %   MCV 93.6 80.0 - 100.0 fL   MCH 29.8 26.0 - 34.0 pg   MCHC 31.8 30.0 - 36.0 g/dL   RDW 57.813.4 46.911.5 - 62.915.5 %   Platelets 240 150 - 400 K/uL   nRBC 0.0 0.0 - 0.2 %    Comment: Performed at [facility], [address] [place], [place] [address]    Dg Abdomen 1 View  Result Date: 07/28/2019 CLINICAL DATA:  NG tube placement EXAM: ABDOMEN - 1 VIEW COMPARISON:  CT earlier today FINDINGS: NG tube coils in the fundus of the stomach. There is an elongated stomach with moderate  gaseous distention. Dilated small bowel loops compatible with small bowel obstruction. No free air or organomegaly. IMPRESSION: NG tube tip coils in the fundus of the stomach. Dilated, elongated stomach. Electronically Signed   By: Charlett NoseKevin  Dover M.D.   On: 07/28/2019 23:20   Ct Abdomen Pelvis W Contrast  Result Date: 07/28/2019 CLINICAL DATA:  Abdominal pain with vomiting EXAM: CT ABDOMEN AND PELVIS WITH CONTRAST TECHNIQUE: Multidetector CT imaging of the abdomen and pelvis was performed using the standard protocol following bolus administration of intravenous contrast. CONTRAST:  100mL OMNIPAQUE IOHEXOL 300 MG/ML  SOLN COMPARISON:  None. FINDINGS: Lower chest: Lung bases demonstrate no acute consolidation or effusion. The heart size is within normal limits. Hepatobiliary: No focal hepatic abnormality. Calcified gallstones. No biliary dilatation Pancreas: Unremarkable. No pancreatic ductal dilatation or surrounding inflammatory changes. Spleen: Normal in size without focal abnormality. Adrenals/Urinary Tract: Adrenal glands are normal. Cysts within the bilateral kidneys. Mild bilateral hydronephrosis and proximal hydroureter. Bladder is markedly distended. Stomach/Bowel: Stomach is moderately enlarged. Multiple dilated loops of mid to distal small bowel measuring up to 3.3 cm in diameter. Transition point appears to be related to a right groin hernia, suspected to represent a femoral hernia. There is mild fluid and stranding about the right groin hernia. There is no colon wall thickening Vascular/Lymphatic: Moderate aortic atherosclerosis. No aneurysm. No significantly enlarged lymph nodes Reproductive: Status post hysterectomy. No adnexal masses. Other: No free air. Small volume of ascites surrounding the liver and within the bilateral colic gutters. Musculoskeletal: Degenerative changes of the spine. No acute osseous abnormality. Probable nerve root cysts at the sacrum. Fluid density right presacral structure  appears to be contiguous with right lower sacral foramen. IMPRESSION: 1. Findings consistent with mechanical small bowel obstruction, transition point appears to be related to a right groin hernia, location and configuration suggests possible femoral hernia. There is some fluid and edema within the hernia sac. 2. Small amount of ascites within the abdomen but negative for free air 3. Gallstones 4. Mild bilateral hydronephrosis and proximal hydroureter, probably secondary to over distention of the bladder Electronically Signed   By: Jasmine PangKim  Fujinaga M.D.   On: 07/28/2019 21:58    Review of Systems  Constitutional: Positive for weight loss. Negative for chills and fever.  HENT: Negative.   Eyes: Negative.   Respiratory: Negative.   Cardiovascular: Negative.   Gastrointestinal: Positive for abdominal pain, constipation, diarrhea, nausea and vomiting.  Genitourinary: Negative.   Musculoskeletal: Negative.   Skin: Negative.   Neurological: Negative.   Endo/Heme/Allergies: Negative.   Psychiatric/Behavioral: Negative.    Blood pressure (!) 156/87, pulse 82, temperature 98.6 F (37 C), temperature source Oral, resp. rate 16, height 3' 5" (1.041 m), weight 36.8 kg, SpO2 96 %. Physical Exam  Constitutional: She is oriented to person, place, and time. She appears well-developed and well-nourished. No distress.  HENT:  Head: Normocephalic and atraumatic.  Right Ear: External ear normal.  Left Ear: External ear normal.  Eyes: Pupils are equal, round, and reactive to light. Conjunctivae are normal. No scleral icterus.  Neck: Normal range of motion. Neck supple. No tracheal deviation present. No thyromegaly present.  Cardiovascular: Normal rate, regular rhythm and normal heart sounds.  No murmur heard. Respiratory: Effort normal and breath sounds normal. No respiratory distress. She has no wheezes.  GI: Soft. Bowel sounds are normal. She exhibits distension (mild) and mass (right groin mass, soft, not  reducible, minimally tender, consistent with incarcerated femoral hernia). There is abdominal tenderness (lower abdomen, mild). There is no rebound and no guarding.  Musculoskeletal: Normal range of motion.        General: No tenderness, deformity or edema.  Neurological: She is alert and oriented to person, place, and time.  Skin: Skin is warm and dry. She is not diaphoretic.  Psychiatric: She has a normal mood and affect. Her behavior is normal.    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  She has an incarcerated right femoral hernia.  They state that the bulge in the right groin has been present for many years and that the patient's primary care physician has been aware of the hernia but it has never been significantly symptomatic.  However, with the development of signs and symptoms of small bowel obstruction and the findings on CT scan, I feel there is an incarcerated femoral hernia causing small bowel obstruction.  This will need immediate exploration and repair for relief of obstruction and to rule out ischemia or infarction of the involved section of small bowel.  I discussed the operation with the patient and her daughter.  I explained that we would start with a small incision in the right groin and explore the hernia.  If the bowel is viable and can be successfully reduced back within the peritoneal cavity, then the patient may only need a femoral hernia repair.  If there is evidence of ischemia or infarction, then the patient may require more extensive surgery with laparotomy and possible small bowel resection.  I will discussed this case with my partner, Dr. Paul Toth, this morning.  He will then evaluate the patient and make a final decision regarding surgery.  Disa Riedlinger, MD [facility] Office: [phone]    Lige Lakeman 07/29/2019, 5:22 AM      

## 2019-07-29 NOTE — H&P (View-Only) (Signed)
General Surgery [facility]  Reason for Consult: small bowel obstruction, right femoral hernia  Referring Physician:  Dr. Stark Jock, [facility]  Leslie Fuller is an 78 y.o. female.   HPI: Patient is a 78 year old female admitted to the medical service with signs and symptoms of small bowel obstruction.  The patient's daughter is at the bedside.  The patient does speak English but the daughter acts as a Optometrist when needed.  Patient has been having intermittent symptoms of abdominal discomfort, constipation, diarrhea, and abdominal pain.  This episode began on July 27, 2019.  Patient had progressive symptoms with abdominal discomfort followed by nausea and vomiting.  Patient presented to [place] for evaluation.  CT scan of abdomen and pelvis demonstrated findings of a right femoral hernia with associated small bowel obstruction.  Patient was transferred to [facility] for admission to the medical service and general surgery was asked to consult.  Past surgical history notable for abdominal hysterectomy.  Past Medical History:  Diagnosis Date  . Anxiety   . Hypertension   . Thyroid disease     Past Surgical History:  Procedure Laterality Date  . ABDOMINAL HYSTERECTOMY    . THYROIDECTOMY      Family History  Problem Relation Age of Onset  . Heart attack Father     Social History:  reports that she has never smoked. She has never used smokeless tobacco. She reports that she does not drink alcohol or use drugs.  Allergies: No Known Allergies  Medications: I have reviewed the patient's current medications.  Results for orders placed or performed during the hospital encounter of 07/28/19 (from the past 48 hour(s))  Lipase, blood     Status: None   Collection Time: 07/28/19  7:48 PM  Result Value Ref Range   Lipase 34 11 - 51 U/L    Comment: Performed at [facility], [address] [place], [place] [address]  Comprehensive  metabolic panel     Status: Abnormal   Collection Time: 07/28/19  7:48 PM  Result Value Ref Range   Sodium 136 135 - 145 mmol/L   Potassium 4.3 3.5 - 5.1 mmol/L   Chloride 96 (L) 98 - 111 mmol/L   CO2 27 22 - 32 mmol/L   Glucose, Bld 129 (H) 70 - 99 mg/dL   BUN 21 8 - 23 mg/dL   Creatinine, Ser 0.68 0.44 - 1.00 mg/dL   Calcium 9.9 8.9 - 10.3 mg/dL   Total Protein 8.5 (H) 6.5 - 8.1 g/dL   Albumin 4.4 3.5 - 5.0 g/dL   AST 30 15 - 41 U/L   ALT 21 0 - 44 U/L   Alkaline Phosphatase 103 38 - 126 U/L   Total Bilirubin 0.9 0.3 - 1.2 mg/dL   GFR calc non Af Amer >60 >60 mL/min   GFR calc Af Amer >60 >60 mL/min   Anion gap 13 5 - 15    Comment: Performed at [facility], [address] [place], [place] [address]  CBC     Status: Abnormal   Collection Time: 07/28/19  7:48 PM  Result Value Ref Range   WBC 11.8 (H) 4.0 - 10.5 K/uL   RBC 5.14 (H) 3.87 - 5.11 MIL/uL   Hemoglobin 15.2 (H) 12.0 - 15.0 g/dL   HCT 46.6 (H) 36.0 - 46.0 %   MCV 90.7 80.0 - 100.0 fL   MCH 29.6 26.0 - 34.0 pg  MCHC 32.6 30.0 - 36.0 g/dL   RDW 45.413.2 09.811.5 - 11.915.5 %   Platelets 298 150 - 400 K/uL   nRBC 0.0 0.0 - 0.2 %    Comment: Performed at [facility], [address] [place], [place] [address]  CBC     Status: None   Collection Time: 07/29/19  3:22 AM  Result Value Ref Range   WBC 8.3 4.0 - 10.5 K/uL   RBC 4.53 3.87 - 5.11 MIL/uL   Hemoglobin 13.5 12.0 - 15.0 g/dL   HCT 95.642.4 21.336.0 - 08.646.0 %   MCV 93.6 80.0 - 100.0 fL   MCH 29.8 26.0 - 34.0 pg   MCHC 31.8 30.0 - 36.0 g/dL   RDW 57.813.4 46.911.5 - 62.915.5 %   Platelets 240 150 - 400 K/uL   nRBC 0.0 0.0 - 0.2 %    Comment: Performed at [facility], [address] [place], [place] [address]    Dg Abdomen 1 View  Result Date: 07/28/2019 CLINICAL DATA:  NG tube placement EXAM: ABDOMEN - 1 VIEW COMPARISON:  CT earlier today FINDINGS: NG tube coils in the fundus of the stomach. There is an elongated stomach with moderate  gaseous distention. Dilated small bowel loops compatible with small bowel obstruction. No free air or organomegaly. IMPRESSION: NG tube tip coils in the fundus of the stomach. Dilated, elongated stomach. Electronically Signed   By: Charlett NoseKevin  Dover M.D.   On: 07/28/2019 23:20   Ct Abdomen Pelvis W Contrast  Result Date: 07/28/2019 CLINICAL DATA:  Abdominal pain with vomiting EXAM: CT ABDOMEN AND PELVIS WITH CONTRAST TECHNIQUE: Multidetector CT imaging of the abdomen and pelvis was performed using the standard protocol following bolus administration of intravenous contrast. CONTRAST:  100mL OMNIPAQUE IOHEXOL 300 MG/ML  SOLN COMPARISON:  None. FINDINGS: Lower chest: Lung bases demonstrate no acute consolidation or effusion. The heart size is within normal limits. Hepatobiliary: No focal hepatic abnormality. Calcified gallstones. No biliary dilatation Pancreas: Unremarkable. No pancreatic ductal dilatation or surrounding inflammatory changes. Spleen: Normal in size without focal abnormality. Adrenals/Urinary Tract: Adrenal glands are normal. Cysts within the bilateral kidneys. Mild bilateral hydronephrosis and proximal hydroureter. Bladder is markedly distended. Stomach/Bowel: Stomach is moderately enlarged. Multiple dilated loops of mid to distal small bowel measuring up to 3.3 cm in diameter. Transition point appears to be related to a right groin hernia, suspected to represent a femoral hernia. There is mild fluid and stranding about the right groin hernia. There is no colon wall thickening Vascular/Lymphatic: Moderate aortic atherosclerosis. No aneurysm. No significantly enlarged lymph nodes Reproductive: Status post hysterectomy. No adnexal masses. Other: No free air. Small volume of ascites surrounding the liver and within the bilateral colic gutters. Musculoskeletal: Degenerative changes of the spine. No acute osseous abnormality. Probable nerve root cysts at the sacrum. Fluid density right presacral structure  appears to be contiguous with right lower sacral foramen. IMPRESSION: 1. Findings consistent with mechanical small bowel obstruction, transition point appears to be related to a right groin hernia, location and configuration suggests possible femoral hernia. There is some fluid and edema within the hernia sac. 2. Small amount of ascites within the abdomen but negative for free air 3. Gallstones 4. Mild bilateral hydronephrosis and proximal hydroureter, probably secondary to over distention of the bladder Electronically Signed   By: Jasmine PangKim  Fujinaga M.D.   On: 07/28/2019 21:58    Review of Systems  Constitutional: Positive for weight loss. Negative for chills and fever.  HENT: Negative.   Eyes: Negative.   Respiratory: Negative.   Cardiovascular: Negative.   Gastrointestinal: Positive for abdominal pain, constipation, diarrhea, nausea and vomiting.  Genitourinary: Negative.   Musculoskeletal: Negative.   Skin: Negative.   Neurological: Negative.   Endo/Heme/Allergies: Negative.   Psychiatric/Behavioral: Negative.    Blood pressure (!) 156/87, pulse 82, temperature 98.6 F (37 C), temperature source Oral, resp. rate 16, height 3\' 5"  (1.041 m), weight 36.8 kg, SpO2 96 %. Physical Exam  Constitutional: She is oriented to person, place, and time. She appears well-developed and well-nourished. No distress.  HENT:  Head: Normocephalic and atraumatic.  Right Ear: External ear normal.  Left Ear: External ear normal.  Eyes: Pupils are equal, round, and reactive to light. Conjunctivae are normal. No scleral icterus.  Neck: Normal range of motion. Neck supple. No tracheal deviation present. No thyromegaly present.  Cardiovascular: Normal rate, regular rhythm and normal heart sounds.  No murmur heard. Respiratory: Effort normal and breath sounds normal. No respiratory distress. She has no wheezes.  GI: Soft. Bowel sounds are normal. She exhibits distension (mild) and mass (right groin mass, soft, not  reducible, minimally tender, consistent with incarcerated femoral hernia). There is abdominal tenderness (lower abdomen, mild). There is no rebound and no guarding.  Musculoskeletal: Normal range of motion.        General: No tenderness, deformity or edema.  Neurological: She is alert and oriented to person, place, and time.  Skin: Skin is warm and dry. She is not diaphoretic.  Psychiatric: She has a normal mood and affect. Her behavior is normal.    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  She has an incarcerated right femoral hernia.  They state that the bulge in the right groin has been present for many years and that the patient's primary care physician has been aware of the hernia but it has never been significantly symptomatic.  However, with the development of signs and symptoms of small bowel obstruction and the findings on CT scan, I feel there is an incarcerated femoral hernia causing small bowel obstruction.  This will need immediate exploration and repair for relief of obstruction and to rule out ischemia or infarction of the involved section of small bowel.  I discussed the operation with the patient and her daughter.  I explained that we would start with a small incision in the right groin and explore the hernia.  If the bowel is viable and can be successfully reduced back within the peritoneal cavity, then the patient may only need a femoral hernia repair.  If there is evidence of ischemia or infarction, then the patient may require more extensive surgery with laparotomy and possible small bowel resection.  I will discussed this case with my partner, Dr. Chevis Pretty, this morning.  He will then evaluate the patient and make a final decision regarding surgery.  Darnell Level, MD [facility] Surgery Office: [phone]    Darnell Level 07/29/2019, 5:22 AM

## 2019-07-29 NOTE — Op Note (Signed)
07/28/2019 - 07/29/2019  4:22 PM  PATIENT:  Leslie Fuller  78 y.o. female  PRE-OPERATIVE DIAGNOSIS:  RIGHT INCARCERATED FEMORAL HERNIA / SMAL BOWEL OBSTRUCTION  POST-OPERATIVE DIAGNOSIS:  RIGHT INCARCERATED FEMORAL HERNIA   PROCEDURE:  Procedure(s): RIGHT FEMORAL HERNIA REPAIR WITH MESH (Right)  SURGEON:  Surgeon(s) and Role:    * Jovita Kussmaul, MD - Primary  PHYSICIAN ASSISTANT:   ASSISTANTS: Modena Jansky, PA   ANESTHESIA:   local and general  EBL:  minimal   BLOOD ADMINISTERED:none  DRAINS: none   LOCAL MEDICATIONS USED:  MARCAINE     SPECIMEN:  Source of Specimen:  hernia sac  DISPOSITION OF SPECIMEN:  PATHOLOGY  COUNTS:  YES  TOURNIQUET:  * No tourniquets in log *  DICTATION: .Dragon Dictation   After informed consent was obtained the patient was brought to the operating room and placed in the supine position on the operating table.  After adequate induction of general anesthesia the patient's abdomen and right groin were prepped with ChloraPrep, allowed to dry, and draped in usual sterile manner.  An appropriate timeout was performed.  The right groin was then infiltrated with quarter percent Marcaine.  A transversely oriented incision was made overlying the palpable bulge with a 15 blade knife.  The incision was carried through the skin and subcutaneous tissue sharply with the electrocautery.  A small bridging vein was clamped with hemostats, divided, and ligated with 3-0 silk ties.  Blunt hemostat dissection was then carried out close to the palpable hernia.  We dissected the hernia out essentially 1 layer at a time until we felt comfortable that there was no loop of bowel in the hernia.  We did encounter a hernia sac which was closed with 2-0 Vicryl stitches.  Again there were no visceral contents within the sac.  There may have been some omentum in the sac at some point.  Once the sac was opened and then the distal sac excised and the hernia sac closed the stump of  the sac was easily reducible back through the femoral hernia.  A 2 layer hernia repair system was chosen and the deep layer was placed through the hernia and spread out inside the inguinal ligament and pelvic floor.  The external part of the hernia repair system was tacked to fascia in all 4 quadrants.  Care was taken to stay well away from the femoral vessels in doing this.  Once this was accomplished the hernia seem well repaired and the mesh was in good position.  The wound was irrigated with saline.  The deep layer of the wound was then closed with a running 2-0 Vicryl stitch.  The skin was then closed with a running 4-0 Monocryl subcuticular stitch.  Dermabond dressings were applied.  The patient tolerated the procedure well.  At the end of the case all needle sponge and instrument counts were correct.  The patient was then awakened and taken recovery in stable condition.  PLAN OF CARE: Admit to inpatient   PATIENT DISPOSITION:  PACU - hemodynamically stable.   Delay start of Pharmacological VTE agent (>24hrs) due to surgical blood loss or risk of bleeding: no

## 2019-07-29 NOTE — Anesthesia Procedure Notes (Signed)
Date/Time: 07/29/2019 4:40 PM Performed by: Glory Buff, CRNA Oxygen Delivery Method: Simple face mask Placement Confirmation: positive ETCO2 and breath sounds checked- equal and bilateral Dental Injury: Teeth and Oropharynx as per pre-operative assessment

## 2019-07-29 NOTE — Anesthesia Preprocedure Evaluation (Addendum)
Anesthesia Evaluation  Patient identified by MRN, date of birth, ID band Patient awake    Reviewed: Allergy & Precautions, NPO status , Patient's Chart, lab work & pertinent test results  Airway Mallampati: I  TM Distance: >3 FB Neck ROM: Full    Dental   Pulmonary    Pulmonary exam normal        Cardiovascular hypertension, Normal cardiovascular exam     Neuro/Psych Anxiety    GI/Hepatic   Endo/Other    Renal/GU      Musculoskeletal   Abdominal   Peds  Hematology   Anesthesia Other Findings   Reproductive/Obstetrics                             Anesthesia Physical Anesthesia Plan  ASA: II and emergent  Anesthesia Plan: General   Post-op Pain Management:    Induction: Intravenous, Cricoid pressure planned and Rapid sequence  PONV Risk Score and Plan: 3 and Ondansetron, Midazolam and Treatment may vary due to age or medical condition  Airway Management Planned: Oral ETT  Additional Equipment:   Intra-op Plan:   Post-operative Plan: Extubation in OR  Informed Consent: I have reviewed the patients History and Physical, chart, labs and discussed the procedure including the risks, benefits and alternatives for the proposed anesthesia with the patient or authorized representative who has indicated his/her understanding and acceptance.       Plan Discussed with: CRNA and Surgeon  Anesthesia Plan Comments:         Anesthesia Quick Evaluation

## 2019-07-29 NOTE — Progress Notes (Signed)
Initial Nutrition Assessment  DOCUMENTATION CODES:   Non-severe (moderate) malnutrition in context of chronic illness, Underweight  INTERVENTION:  - diet advancement as medically feasible.   NUTRITION DIAGNOSIS:   Moderate Malnutrition related to chronic illness as evidenced by moderate fat depletion, moderate muscle depletion.  GOAL:   Patient will meet greater than or equal to 90% of their needs  MONITOR:   Diet advancement, Labs, Weight trends, I & O's  REASON FOR ASSESSMENT:   Malnutrition Screening Tool  ASSESSMENT:   78 y.o. female with medical history significant of anxiety, HTN, thyroid disease, and hysterectomy. She presented to the ED on 8/24 d/t abdominal pain, constipation, and vomiting x1 day. Daughter reported that patient is chronically constipated and it is unknown when her last BM was. She has also had chronic bladder problems since her hysterectomy.  Patient has been NPO since admission. NGT in place with 150 ml output this shift. She reports ongoing abdominal pain/pressure and nausea despite NGT. No vomiting since admission. She reports fair appetite at baseline with inconsistent eating habits/schedule and that for the 1-2 days PTA she was able to eat and drink very little to nothing d/t discomfort in addition to difficulty keeping down solid foods during that time.   Per chart review, current weight is 81 lb and PTA the most recently recorded weight was on [facility] on 06/05/19 when she weighed 83 lb. This indicates 2 lb weight loss (2.4% body weight) in the past 1.5 months; not significant for time frame.  Per notes: - incarcerated R femoral hernia with SBO--plan for OR today (8/25)   Labs reviewed; Ca: 8.7 mg/dl. Medications reviewed; 25 mcg IV synthroid/day.  IVF; NS @ 125 ml/hr.      NUTRITION - FOCUSED PHYSICAL EXAM:    Most Recent Value  Orbital Region  Mild depletion  Upper Arm Region  Severe depletion  Thoracic and Lumbar Region  Unable to  assess  Buccal Region  Moderate depletion  Temple Region  Moderate depletion  Clavicle Bone Region  Severe depletion  Clavicle and Acromion Bone Region  Severe depletion  Scapular Bone Region  Unable to assess  Dorsal Hand  Moderate depletion  Patellar Region  Mild depletion  Anterior Thigh Region  Moderate depletion  Posterior Calf Region  Moderate depletion  Edema (RD Assessment)  None  Hair  Reviewed  Eyes  Reviewed  Mouth  Reviewed  Skin  Reviewed  Nails  Reviewed       Diet Order:   Diet Order            Diet NPO time specified  Diet effective midnight        Diet NPO time specified  Diet effective now              EDUCATION NEEDS:   Not appropriate for education at this time  Skin:  Skin Assessment: Reviewed RN Assessment  Last BM:  PTA/unknown  Height:   Ht Readings from Last 1 Encounters:  07/29/19 5' (1.524 m)    Weight:   Wt Readings from Last 1 Encounters:  07/29/19 36.8 kg    Ideal Body Weight:  45.4 kg  BMI:  Body mass index is 15.84 kg/m.  Estimated Nutritional Needs:   Kcal:  1475-1660 kcal  Protein:  55-70 grams  Fluid:  >/= 1.8 L/day     Jarome Matin, MS, RD, LDN, [facility] Inpatient Clinical Dietitian Pager # [phone] After hours/weekend pager # [phone]

## 2019-07-29 NOTE — Interval H&P Note (Signed)
History and Physical Interval Note:  07/29/2019 2:34 PM  Leslie Fuller  has presented today for surgery, with the diagnosis of RIGHT INCARCERATED FEMORAL HERNIA / [place].  The various methods of treatment have been discussed with the patient and family. After consideration of risks, benefits and other options for treatment, the patient has consented to  Procedure(s): RIGHT FEMORAL HERNIA REPAIR (Right) as a surgical intervention.  The patient's history has been reviewed, patient examined, no change in status, stable for surgery.  I have reviewed the patient's chart and labs.  Questions were answered to the patient's satisfaction.     [name]

## 2019-07-29 NOTE — Anesthesia Procedure Notes (Signed)
Procedure Name: Intubation Date/Time: 07/29/2019 3:21 PM Performed by: Glory Buff, CRNA Pre-anesthesia Checklist: Patient identified, Emergency Drugs available, Suction available and Patient being monitored Patient Re-evaluated:Patient Re-evaluated prior to induction Oxygen Delivery Method: Circle system utilized Preoxygenation: Pre-oxygenation with 100% oxygen Induction Type: IV induction, Rapid sequence and Cricoid Pressure applied Laryngoscope Size: Miller and 3 Grade View: Grade I Tube type: Oral Number of attempts: 1 Airway Equipment and Method: Stylet and Oral airway Placement Confirmation: ETT inserted through vocal cords under direct vision,  positive ETCO2 and breath sounds checked- equal and bilateral Secured at: 20 cm Tube secured with: Tape Dental Injury: Teeth and Oropharynx as per pre-operative assessment  Comments: NG tube to suction upon entering OR, pre O2 x 5 minutes.

## 2019-07-29 NOTE — Transfer of Care (Signed)
Immediate Anesthesia Transfer of Care Note  Patient: Leslie Fuller  Procedure(s) Performed: RIGHT FEMORAL HERNIA REPAIR WITH MESH (Right Groin)  Patient Location: PACU  Anesthesia Type:General  Level of Consciousness: awake and alert   Airway & Oxygen Therapy: Patient Spontanous Breathing and Patient connected to face mask oxygen  Post-op Assessment: Report given to RN and Post -op Vital signs reviewed and stable  Post vital signs: Reviewed and stable  Last Vitals:  Vitals Value Taken Time  BP 163/81 07/29/19 1700  Temp 36.9 C 07/29/19 1649  Pulse 87 07/29/19 1704  Resp 19 07/29/19 1704  SpO2 90 % 07/29/19 1704  Vitals shown include unvalidated device data.  Last Pain:  Vitals:   07/29/19 1700  TempSrc:   PainSc: 5       Patients Stated Pain Goal: 2 (82/06/01 5615)  Complications: No apparent anesthesia complications

## 2019-07-29 NOTE — Progress Notes (Signed)
Day of Surgery    CC: Abdominal pain  Subjective: Patient still uncomfortable lying in bed.  NG is in place and working.  Complains of generalized abdominal discomfort.  She has palpable right inguinal hernia that is currently soft.    Objective: Vital signs in last 24 hours: Temp:  [98.6 F (37 C)-98.7 F (37.1 C)] 98.6 F (37 C) (08/25 0127) Pulse Rate:  [78-99] 82 (08/25 0127) Resp:  [16-18] 16 (08/25 0127) BP: (138-169)/(80-94) 156/87 (08/25 0127) SpO2:  [94 %-99 %] 96 % (08/25 0127) Weight:  [36.5 kg-36.8 kg] 36.8 kg (08/25 0145) Last BM Date: (unable to provide date) Afebrile, VSS- BP up some Labs OK Intake/Output from previous day: 08/24 0701 - 08/25 0700 In: 1543.3 [I.V.:543.1; IV Piggyback:1000.2] Out: 550 [Urine:300; Emesis/NG output:250] Intake/Output this shift: No intake/output data recorded.  General appearance: alert, cooperative and no distress Resp: clear to auscultation bilaterally GI: Soft some mild generalized distention.  Generalized abdominal discomfort.  Palpable right femoral hernia.  Lab Results:  Recent Labs    07/28/19 1948 07/29/19 0322  WBC 11.8* 8.3  HGB 15.2* 13.5  HCT 46.6* 42.4  PLT 298 240    BMET Recent Labs    07/28/19 1948 07/29/19 0641  NA 136 142  K 4.3 3.8  CL 96* 106  CO2 27 28  GLUCOSE 129* 100*  BUN 21 20  CREATININE 0.68 0.64  CALCIUM 9.9 8.7*   PT/INR No results for input(s): LABPROT, INR in the last 72 hours.  Recent Labs  Lab 07/28/19 1948  AST 30  ALT 21  ALKPHOS 103  BILITOT 0.9  PROT 8.5*  ALBUMIN 4.4     Lipase     Component Value Date/Time   LIPASE 34 07/28/2019 1948     Medications: . Chlorhexidine Gluconate Cloth  6 each Topical Once   And  . Chlorhexidine Gluconate Cloth  6 each Topical Once  . heparin  5,000 Units Subcutaneous Q8H  . levothyroxine  25 mcg Intravenous Q0600  . metoprolol tartrate  2.5 mg Intravenous Q8H  . sodium chloride flush  3 mL Intravenous Once   .  sodium chloride 125 mL/hr at 07/29/19 0815    Assessment/Plan Hypertension Thyroid disease Anxiety COVID is pending  Incarcerated right femoral hernia with associated small bowel obstruction  - Plan OR 07/29/19  FEN:  IV fluids/NPO ID:  Cefotetan pre op DVT:  Heparin Follow up:  Dr. Marlou Starks  Plan:  Surgery later today, awaiting COVID also.       LOS: 1 day    Leslie Fuller 07/29/2019 [phone]

## 2019-07-29 NOTE — Progress Notes (Signed)
I have seen and assess patient and agree with Dr. Elon Jester assessment and plan.  Patient is a pleasant 78 year old [name] female history of hypertension, anxiety, thyroid disease, abdominal hysterectomy presented to the ED with abdominal pain, nausea vomiting and complaints of constipation.  CT abdomen and pelvis which was done was consistent with mechanical small bowel obstruction secondary to incarcerated femoral hernia.  Patient currently on bowel rest.  Patient seen in consultation by general surgery who I recommended immediate exploration and repair for relief of obstruction and to rule out ischemia or infarct of the involved section of the small bowel.  Continue bowel rest, IV fluids, pain management, supportive care.  Appreciate general surgery's input and recommendations.  No charge.

## 2019-07-29 NOTE — H&P (Signed)
History and Physical    Leslie MannersMaria Fuller YNW:[id number] DOB: October 31, 1941 DOA: 07/28/2019  PCP: No primary care provider on file. [facility]  Chief Complaint: Abdominal pain, constipation, vomiting  HPI: Leslie MannersMaria Fuller is a 78 y.o. female with medical history significant of anxiety, hypertension, thyroid disease, abdominal hysterectomy [facility] for evaluation of abdominal pain, constipation, and vomiting.  History provided by patient and daughter at bedside.  Patient has been having generalized abdominal pain and vomiting for the past 1 day.  She has not been able to keep any food down.  Daughter states patient is chronically constipated and does not remember when her last bowel movement was.  She has not been passing gas.  States he has chronic bladder problems since her hysterectomy.  No other complaints.  ED Course: Blood pressure slightly elevated, remainder of vitals stable.  Afebrile.  White count 11.8.  Lipase and LFTs normal.  UA pending.  COVID-19 test pending.  CT abdomen pelvis showing findings consistent with mechanical small bowel obstruction.  Transition point appears to be related to a right groin hernia, location and configuration suggest possible femoral hernia.  There is some fluid and edema within the hernia sac.  Small amount of ascites within the abdomen but negative for free air.  CT also showing mild bilateral hydronephrosis and proximal hydroureter, probably secondary to overdistention of the bladder.  Case was discussed with Dr. Gerrit FriendsGerkin from general surgery, will consult in am. NG tube placed in the ED.  Review of Systems:  All systems reviewed and apart from history of presenting illness, are negative.  Past Medical History:  Diagnosis Date   Anxiety    Hypertension    Thyroid disease     Past Surgical History:  Procedure Laterality Date   ABDOMINAL HYSTERECTOMY     THYROIDECTOMY       reports that she has never smoked. She has never  used smokeless tobacco. She reports that she does not drink alcohol or use drugs.  No Known Allergies  Family History  Problem Relation Age of Onset   Heart attack Father     Prior to Admission medications   Medication Sig Start Date End Date Taking? Authorizing Provider  diltiazem (CARDIZEM CD) 240 MG 24 hr capsule Take by mouth. 11/22/16 09/03/19 Yes [provider]  levothyroxine (SYNTHROID) 50 MCG tablet Take by mouth. 06/16/17 09/03/19 Yes [provider]  loratadine (CLARITIN) 10 MG tablet Take by mouth. 09/06/18  Yes [provider]  LORazepam (ATIVAN) 1 MG tablet Take by mouth. 06/05/19 06/05/20 Yes [provider]  mirtazapine (REMERON) 15 MG tablet TAKE 1 TABLET BY MOUTH NIGHTLY 08/27/14  Yes [provider]  pravastatin (PRAVACHOL) 10 MG tablet Take by mouth. 06/13/17 09/03/19 Yes [provider]  sertraline (ZOLOFT) 100 MG tablet 1.5 tablets per day 06/16/17  Yes [provider]    Physical Exam: Vitals:   07/29/19 0021 07/29/19 0040 07/29/19 0127 07/29/19 0145  BP: 138/82 (!) 146/88 (!) 156/87   Pulse: 78 81 82   Resp: 16  16   Temp:   98.6 F (37 C)   TempSrc:   Oral   SpO2: 97% 94% 96%   Weight:    36.8 kg  Height:    3\' 5"  (1.041 m)    Physical Exam  Constitutional: She is oriented to person, place, and time. No distress.  HENT:  Head: Normocephalic.  Dry mucous membranes NG tube in place  Eyes: Right eye  exhibits no discharge. Left eye exhibits no discharge.  Neck: Neck supple.  Cardiovascular: Normal rate, regular rhythm and intact distal pulses.  Pulmonary/Chest: Effort normal and breath sounds normal. No respiratory distress. She has no wheezes. She has no rales.  Abdominal: Soft. She exhibits no distension. There is no abdominal tenderness. There is no rebound and no guarding.  Hypoactive bowel sounds  Musculoskeletal:        General: No edema.  Neurological: She is alert and oriented to person,  place, and time.  Skin: Skin is warm and dry. She is not diaphoretic.     Labs on Admission: I have personally reviewed following labs and imaging studies  CBC: Recent Labs  Lab 07/28/19 1948  WBC 11.8*  HGB 15.2*  HCT 46.6*  MCV 90.7  PLT 295   Basic Metabolic Panel: Recent Labs  Lab 07/28/19 1948  NA 136  K 4.3  CL 96*  CO2 27  GLUCOSE 129*  BUN 21  CREATININE 0.68  CALCIUM 9.9   GFR: Estimated Creatinine Clearance: 14.5 mL/min (by C-G formula based on SCr of 0.68 mg/dL). Liver Function Tests: Recent Labs  Lab 07/28/19 1948  AST 30  ALT 21  ALKPHOS 103  BILITOT 0.9  PROT 8.5*  ALBUMIN 4.4   Recent Labs  Lab 07/28/19 1948  LIPASE 34   No results for input(s): AMMONIA in the last 168 hours. Coagulation Profile: No results for input(s): INR, PROTIME in the last 168 hours. Cardiac Enzymes: No results for input(s): CKTOTAL, CKMB, CKMBINDEX, TROPONINI in the last 168 hours. BNP (last 3 results) No results for input(s): PROBNP in the last 8760 hours. HbA1C: No results for input(s): HGBA1C in the last 72 hours. CBG: No results for input(s): GLUCAP in the last 168 hours. Lipid Profile: No results for input(s): CHOL, HDL, LDLCALC, TRIG, CHOLHDL, LDLDIRECT in the last 72 hours. Thyroid Function Tests: No results for input(s): TSH, T4TOTAL, FREET4, T3FREE, THYROIDAB in the last 72 hours. Anemia Panel: No results for input(s): VITAMINB12, FOLATE, FERRITIN, TIBC, IRON, RETICCTPCT in the last 72 hours. Urine analysis: No results found for: COLORURINE, APPEARANCEUR, LABSPEC, PHURINE, GLUCOSEU, HGBUR, BILIRUBINUR, KETONESUR, PROTEINUR, UROBILINOGEN, NITRITE, LEUKOCYTESUR  Radiological Exams on Admission: Dg Abdomen 1 View  Result Date: 07/28/2019 CLINICAL DATA:  NG tube placement EXAM: ABDOMEN - 1 VIEW COMPARISON:  CT earlier today FINDINGS: NG tube coils in the fundus of the stomach. There is an elongated stomach with moderate gaseous distention. Dilated small  bowel loops compatible with small bowel obstruction. No free air or organomegaly. IMPRESSION: NG tube tip coils in the fundus of the stomach. Dilated, elongated stomach. Electronically Signed   By: Rolm Baptise M.D.   On: 07/28/2019 23:20   Ct Abdomen Pelvis W Contrast  Result Date: 07/28/2019 CLINICAL DATA:  Abdominal pain with vomiting EXAM: CT ABDOMEN AND PELVIS WITH CONTRAST TECHNIQUE: Multidetector CT imaging of the abdomen and pelvis was performed using the standard protocol following bolus administration of intravenous contrast. CONTRAST:  137mL OMNIPAQUE IOHEXOL 300 MG/ML  SOLN COMPARISON:  None. FINDINGS: Lower chest: Lung bases demonstrate no acute consolidation or effusion. The heart size is within normal limits. Hepatobiliary: No focal hepatic abnormality. Calcified gallstones. No biliary dilatation Pancreas: Unremarkable. No pancreatic ductal dilatation or surrounding inflammatory changes. Spleen: Normal in size without focal abnormality. Adrenals/Urinary Tract: Adrenal glands are normal. Cysts within the bilateral kidneys. Mild bilateral hydronephrosis and proximal hydroureter. Bladder is markedly distended. Stomach/Bowel: Stomach is moderately enlarged. Multiple dilated loops of mid to  distal small bowel measuring up to 3.3 cm in diameter. Transition point appears to be related to a right groin hernia, suspected to represent a femoral hernia. There is mild fluid and stranding about the right groin hernia. There is no colon wall thickening Vascular/Lymphatic: Moderate aortic atherosclerosis. No aneurysm. No significantly enlarged lymph nodes Reproductive: Status post hysterectomy. No adnexal masses. Other: No free air. Small volume of ascites surrounding the liver and within the bilateral colic gutters. Musculoskeletal: Degenerative changes of the spine. No acute osseous abnormality. Probable nerve root cysts at the sacrum. Fluid density right presacral structure appears to be contiguous with right  lower sacral foramen. IMPRESSION: 1. Findings consistent with mechanical small bowel obstruction, transition point appears to be related to a right groin hernia, location and configuration suggests possible femoral hernia. There is some fluid and edema within the hernia sac. 2. Small amount of ascites within the abdomen but negative for free air 3. Gallstones 4. Mild bilateral hydronephrosis and proximal hydroureter, probably secondary to over distention of the bladder Electronically Signed   By: Jasmine Pang M.D.   On: 07/28/2019 21:58    Assessment/Plan Principal Problem:   SBO (small bowel obstruction) (HCC) Active Problems:   Leukocytosis   Bladder distention   SBO CT abdomen pelvis showing findings consistent with mechanical small bowel obstruction.  Transition point appears to be related to a right groin hernia, location and configuration suggest possible femoral hernia.  There is some fluid and edema within the hernia sac.  Small amount of ascites within the abdomen but negative for free air.  -ED provider discussed the case with Dr. Gerrit Friends from general surgery, will consult in am.  -NG tube placed -Bowel rest, keep n.p.o. -IV fluid hydration -IV Zofran PRN nausea -IV morphine PRN pain  Mild leukocytosis Likely reactive. White count 11.8.  Patient is afebrile.  Lungs clear and no respiratory complaints. -UA pending  Overdistention of bladder, mild bilateral hydronephrosis CT showing mild bilateral hydronephrosis and proximal hydroureter, probably secondary to overdistention of the bladder.  Per patient's daughter and nursing staff patient has urinated since her CT scan.  Bladder scan showing 49 cc.  -Monitor urine output.  Bladder scan q6 hrs. May need Foley if any signs of urinary retention.  Pharmacy med rec pending.  DVT prophylaxis: Subcutaneous heparin Code Status: Patient wishes to be full code. Family Communication: Daughter at bedside. Disposition Plan: Anticipate  discharge after clinical improvement. Consults called: General surgery Admission status: It is my clinical opinion that admission to INPATIENT is reasonable and necessary in this 78 y.o. female  presenting with intractable nausea and vomiting secondary to SBO.  Needs bowel rest and NG tube decompression.  General surgery will see the patient in the morning  Given the aforementioned, the predictability of an adverse outcome is felt to be significant. I expect that the patient will require at least 2 midnights in the hospital to treat this condition.   The medical decision making on this patient was of high complexity and the patient is at high risk for clinical deterioration, therefore this is a level 3 visit.  John Giovanni MD [facility] Pager [phone]  If 7PM-7AM, please contact night-coverage [email or web address] Password [facility]  07/29/2019, 3:48 AM

## 2019-07-30 ENCOUNTER — Encounter (HOSPITAL_COMMUNITY): Payer: Self-pay | Admitting: General Surgery

## 2019-07-30 LAB — CBC WITH DIFFERENTIAL/PLATELET
Abs Immature Granulocytes: 0.05 10*3/uL (ref 0.00–0.07)
Basophils Absolute: 0 10*3/uL (ref 0.0–0.1)
Basophils Relative: 0 %
Eosinophils Absolute: 0 10*3/uL (ref 0.0–0.5)
Eosinophils Relative: 0 %
HCT: 40.8 % (ref 36.0–46.0)
Hemoglobin: 12.7 g/dL (ref 12.0–15.0)
Immature Granulocytes: 0 %
Lymphocytes Relative: 4 %
Lymphs Abs: 0.4 10*3/uL — ABNORMAL LOW (ref 0.7–4.0)
MCH: 29.3 pg (ref 26.0–34.0)
MCHC: 31.1 g/dL (ref 30.0–36.0)
MCV: 94 fL (ref 80.0–100.0)
Monocytes Absolute: 0.5 10*3/uL (ref 0.1–1.0)
Monocytes Relative: 4 %
Neutro Abs: 10.3 10*3/uL — ABNORMAL HIGH (ref 1.7–7.7)
Neutrophils Relative %: 92 %
Platelets: 197 10*3/uL (ref 150–400)
RBC: 4.34 MIL/uL (ref 3.87–5.11)
RDW: 13.4 % (ref 11.5–15.5)
WBC: 11.3 10*3/uL — ABNORMAL HIGH (ref 4.0–10.5)
nRBC: 0 % (ref 0.0–0.2)

## 2019-07-30 LAB — COMPREHENSIVE METABOLIC PANEL
ALT: 16 U/L (ref 0–44)
AST: 22 U/L (ref 15–41)
Albumin: 3.2 g/dL — ABNORMAL LOW (ref 3.5–5.0)
Alkaline Phosphatase: 71 U/L (ref 38–126)
Anion gap: 10 (ref 5–15)
BUN: 17 mg/dL (ref 8–23)
CO2: 26 mmol/L (ref 22–32)
Calcium: 8.2 mg/dL — ABNORMAL LOW (ref 8.9–10.3)
Chloride: 100 mmol/L (ref 98–111)
Creatinine, Ser: 0.54 mg/dL (ref 0.44–1.00)
GFR calc Af Amer: >60 mL/min (ref >60)
GFR calc non Af Amer: >60 mL/min (ref >60)
Glucose, Bld: 71 mg/dL (ref 70–99)
Potassium: 2.9 mmol/L — ABNORMAL LOW (ref 3.5–5.1)
Sodium: 136 mmol/L (ref 135–145)
Total Bilirubin: 1.2 mg/dL (ref 0.3–1.2)
Total Protein: 6.3 g/dL — ABNORMAL LOW (ref 6.5–8.1)

## 2019-07-30 LAB — URINE CULTURE: Culture: NO GROWTH

## 2019-07-30 LAB — MAGNESIUM: Magnesium: 1.9 mg/dL (ref 1.7–2.4)

## 2019-07-30 MED ORDER — PHENOL 1.4 % MT LIQD
1.0000 | OROMUCOSAL | Status: DC | PRN
  Administered 2019-07-30: 1 via OROMUCOSAL
  Filled 2019-07-30: qty 177

## 2019-07-30 MED ORDER — TRAMADOL HCL 50 MG PO TABS: 50.0000 mg | ORAL_TABLET | Freq: Four times a day (QID) | ORAL | Status: DC | PRN

## 2019-07-30 MED ORDER — MAGNESIUM SULFATE 2 GM/50ML IV SOLN
2.0000 g | Freq: Once | INTRAVENOUS | Status: AC
  Administered 2019-07-30: 10:00:00 2 g via INTRAVENOUS
  Filled 2019-07-30: qty 50

## 2019-07-30 MED ORDER — POTASSIUM CHLORIDE IN NACL 40-0.9 MEQ/L-% IV SOLN
INTRAVENOUS | Status: DC
  Administered 2019-07-30 – 2019-07-31 (×3): 100 mL/h via INTRAVENOUS
  Filled 2019-07-30 (×4): qty 1000

## 2019-07-30 MED ORDER — ACETAMINOPHEN 500 MG PO TABS
1000.0000 mg | ORAL_TABLET | Freq: Three times a day (TID) | ORAL | Status: DC
  Administered 2019-07-30 – 2019-07-31 (×3): 1000 mg via ORAL
  Filled 2019-07-30 (×3): qty 2

## 2019-07-30 MED ORDER — IBUPROFEN 400 MG PO TABS
600.0000 mg | ORAL_TABLET | Freq: Four times a day (QID) | ORAL | Status: DC | PRN
  Administered 2019-07-30: 600 mg via ORAL
  Filled 2019-07-30: qty 1

## 2019-07-30 MED ORDER — TRAMADOL HCL 50 MG PO TABS: 100.0000 mg | ORAL_TABLET | Freq: Four times a day (QID) | ORAL | Status: DC | PRN

## 2019-07-30 NOTE — Progress Notes (Addendum)
1 Day Post-Op    CC:  Abdominal pain  Subjective: She is doing well NG is still in and she has not eaten so far.  I took out the NG out just now.  [name] interpreter [phone] was used to assist with the exam and explanation of the plan along with postop care.  She is sore she feels better with the NG out and her incision site looks fine.  Objective: Vital signs in last 24 hours: Temp:  [98 F (36.7 C)-98.9 F (37.2 C)] 98.9 F (37.2 C) (08/26 0529) Pulse Rate:  [74-90] 88 (08/26 0529) Resp:  [11-19] 16 (08/26 0529) BP: (117-180)/(63-91) 152/69 (08/26 0529) SpO2:  [96 %-100 %] 100 % (08/26 0529) Last BM Date: (unable to provide date) Nothing PO recorded  2800 IV 1575 urine  1200 Emesis/NG  Afebrile, VSS K+ 2.9 WBC 11.3   Intake/Output from previous day: 08/25 0701 - 08/26 0700 In: 2800 [I.V.:2800] Out: 2775 [Urine:1575; Emesis/NG output:1200] Intake/Output this shift: Total I/O In: -  Out: 150 [Urine:150]  General appearance: alert, cooperative and no distress Resp: clear to auscultation bilaterally GI: Soft, sore, incision/site looks fine.  Lab Results:  Recent Labs    07/29/19 0322 07/30/19 0250  WBC 8.3 11.3*  HGB 13.5 12.7  HCT 42.4 40.8  PLT 240 197    BMET Recent Labs    07/29/19 0641 07/30/19 0250  NA 142 136  K 3.8 2.9*  CL 106 100  CO2 28 26  GLUCOSE 100* 71  BUN 20 17  CREATININE 0.64 0.54  CALCIUM 8.7* 8.2*   PT/INR No results for input(s): LABPROT, INR in the last 72 hours.  Recent Labs  Lab 07/28/19 1948 07/30/19 0250  AST 30 22  ALT 21 16  ALKPHOS 103 71  BILITOT 0.9 1.2  PROT 8.5* 6.3*  ALBUMIN 4.4 3.2*     Lipase     Component Value Date/Time   LIPASE 34 07/28/2019 1948     Medications: . heparin  5,000 Units Subcutaneous Q8H  . levothyroxine  25 mcg Intravenous Q0600  . metoprolol tartrate  2.5 mg Intravenous Q8H  . sodium chloride flush  3 mL Intravenous Once   . 0.9 % NaCl with KCl 40 mEq / L    .  magnesium sulfate bolus IVPB      Assessment/Plan Hypertension Thyroid disease Anxiety COVID is negative Hypokalemia -K+ 2.9   INCARCERATED FEMORAL HERNIA / SMAL BOWEL OBSTRUCTION RIGHT FEMORAL HERNIA REPAIR WITH MESH (Right), 07/29/19, Dr. Mamie Nick TothIII  FEN:  Clears/ IV fluids CB:JSEGBT preop DVT:  SCD -if she does not go home we can restart anticoagulation at 1600 hrs. today. Follow up:  Dr. Marlou Starks   Plan: I am going to advance her diet.  If she has no issues she can go home in the next 24 hours.  If she has an issue we will have to consider laparoscopy.  I discussed and explained that we found no bowel in her hernia at the time of her operation.  Dr. Verlon Au is replacing her potassium.    LOS: 2 days    Lynsi Dooner 07/30/2019 [phone]

## 2019-07-30 NOTE — Plan of Care (Signed)

## 2019-07-30 NOTE — Discharge Instructions (Signed)
HERNIA REPAIR: POST OP INSTRUCTIONS ° °###################################################################### ° °EAT °Gradually transition to a high fiber diet with a fiber supplement over the next few weeks after discharge.  Start with a pureed / full liquid diet (see below) ° °WALK °Walk an hour a day.  Control your pain to do that.   ° °CONTROL PAIN °Control pain so that you can walk, sleep, tolerate sneezing/coughing, and go up/down stairs. ° °HAVE A BOWEL MOVEMENT DAILY °Keep your bowels regular to avoid problems.  OK to try a laxative to override constipation.  OK to use an antidairrheal to slow down diarrhea.  Call if not better after 2 tries ° °CALL IF YOU HAVE PROBLEMS/CONCERNS °Call if you are still struggling despite following these instructions. °Call if you have concerns not answered by these instructions ° °###################################################################### ° ° ° °1. DIET: Follow a light bland diet & liquids the first 24 hours after arrival home, such as soup, liquids, starches, etc.  Be sure to drink plenty of fluids.  Quickly advance to a usual solid diet within a few days.  Avoid fast food or heavy meals as your are more likely to get nauseated or have irregular bowels.  A low-fat, high-fiber diet for the rest of your life is ideal.  ° °2. Take your usually prescribed home medications unless otherwise directed. ° °3. PAIN CONTROL: °a. Pain is best controlled by a usual combination of three different methods TOGETHER: °i. Ice/Heat °ii. Over the counter pain medication °iii. Prescription pain medication °b. Most patients will experience some swelling and bruising around the hernia(s) such as the bellybutton, groins, or old incisions.  Ice packs or heating pads (30-60 minutes up to 6 times a day) will help. Use ice for the first few days to help decrease swelling and bruising, then switch to heat to help relax tight/sore spots and speed recovery.  Some people prefer to use ice  alone, heat alone, alternating between ice & heat.  Experiment to what works for you.  Swelling and bruising can take several weeks to resolve.   °c. It is helpful to take an over-the-counter pain medication regularly for the first few weeks.  Choose one of the following that works best for you: °i. Naproxen (Aleve, etc)  Two 220mg tabs twice a day °ii. Ibuprofen (Advil, etc) Three 200mg tabs four times a day (every meal & bedtime) °iii. Acetaminophen (Tylenol, etc) 325-650mg four times a day (every meal & bedtime) °d. A  prescription for pain medication should be given to you upon discharge.  Take your pain medication as prescribed.  °i. If you are having problems/concerns with the prescription medicine (does not control pain, nausea, vomiting, rash, itching, etc), please call us [phone] to see if we need to switch you to a different pain medicine that will work better for you and/or control your side effect better. °ii. If you need a refill on your pain medication, please contact your pharmacy.  They will contact our office to request authorization. Prescriptions will not be filled after 5 pm or on week-ends. ° °4. Avoid getting constipated.  Between the surgery and the pain medications, it is common to experience some constipation.  Increasing fluid intake and taking a fiber supplement (such as Metamucil, Citrucel, FiberCon, MiraLax, etc) 1-2 times a day regularly will usually help prevent this problem from occurring.  A mild laxative (prune juice, Milk of Magnesia, MiraLax, etc) should be taken according to package directions if there are no bowel movements after 48   hours.   ° °5. Wash / shower every day.  You may shower over the dressings as they are waterproof.   ° °6. Remove your waterproof bandages, skin tapes, and other bandages 5 days after surgery. You may replace a dressing/Band-Aid to cover the incision for comfort if you wish. You may leave the incisions open to air.  You may replace a  dressing/Band-Aid to cover an incision for comfort if you wish.  Continue to shower over incision(s) after the dressing is off. ° °7. ACTIVITIES as tolerated:   °a. You may resume regular (light) daily activities beginning the next day--such as daily self-care, walking, climbing stairs--gradually increasing activities as tolerated.  Control your pain so that you can walk an hour a day.  If you can walk 30 minutes without difficulty, it is safe to try more intense activity such as jogging, treadmill, bicycling, low-impact aerobics, swimming, etc. °b. Save the most intensive and strenuous activity for last such as sit-ups, heavy lifting, contact sports, etc  Refrain from any heavy lifting or straining until you are off narcotics for pain control.   °c. DO NOT PUSH THROUGH PAIN.  Let pain be your guide: If it hurts to do something, don't do it.  Pain is your body warning you to avoid that activity for another week until the pain goes down. °d. You may drive when you are no longer taking prescription pain medication, you can comfortably wear a seatbelt, and you can safely maneuver your car and apply brakes. °e. You may have sexual intercourse when it is comfortable.  ° °8. FOLLOW UP in our office °a. Please call [facility] at [phone] to set up an appointment to see your surgeon in the office for a follow-up appointment approximately 2-3 weeks after your surgery. °b. Make sure that you call for this appointment the day you arrive home to insure a convenient appointment time. ° °9.  If you have disability of FMLA / Family leave forms, please bring the forms to the office for processing.  (do not give to your surgeon). ° °WHEN TO CALL US [phone]: °1. Poor pain control °2. Reactions / problems with new medications (rash/itching, nausea, etc)  °3. Fever over 101.5 F (38.5 C) °4. Inability to urinate °5. Nausea and/or vomiting °6. Worsening swelling or bruising °7. Continued bleeding from incision. °8. Increased pain,  redness, or drainage from the incision ° ° The clinic staff is available to answer your questions during regular business hours (8:30am-5pm).  Please don’t hesitate to call and ask to speak to one of our nurses for clinical concerns.  ° If you have a medical emergency, go to the nearest emergency room or call 911. ° A surgeon from [facility] is always on call at the hospitals in [place] ° °[facility] °[address], [address], [place], [place]  [address] ? ° [address] [address], [place], [place]   [address] °MAIN: [phone] ? TOLL FREE: [phone] ? FAX: [phone] °[email or web address] ° °

## 2019-07-30 NOTE — Plan of Care (Signed)

## 2019-07-30 NOTE — Progress Notes (Signed)
  PROGRESS NOTE  Leslie Fuller OVF:[id number] DOB: [birth year] DOA: 07/28/2019 PCP: No primary care provider on file.  Brief History   78 year old Leslie Fuller female no significant medical history other than mild anxiety thyroid disease hypertension She had a previous abdominal hysterectomy in the distant past-was seen at med center [facility] on 8/25 early a.m. and transferred to [facility]: Found to have a possible incarcerated right femoral hernia with associated SBO Taken emergently for surgery 8/25 later in the day  A & P  SBO,?  Incarcerated femoral hernia Defer to surgery plan of care, weight lifting precautions, diet etc. Looks like they graduated diet this afternoon and she is tolerating Defer pain management to them Hypokalemia Saline with 40 of K, replace magnesium 2 g now, labs a.m. HTN Resume Cardizem 240 daily Hypothyroidism Resume levothyroxine 50 daily except Sundays Hyperlipidemia-continue statin, bipolar/depression continue sertraline Rest of medical problems are stable  Greatly appreciate general surgery input-let us know if you think she can go home tomorrow and what type of follow-up she needs in the office   Verneita Griffes, MD [facility] 8:48 AM  07/30/2019, 8:48 AM  LOS: 2 days   Consultants  . General surgery  Procedures  . Right femoral hernia repair with mesh  Antibiotics  . Perioperative  Interval History/Subjective  Patient looks well she is eaten some food today she does not appear to be in significant pain  Objective   Vitals:  Vitals:   07/30/19 0119 07/30/19 0529  BP: (!) 147/63 (!) 152/69  Pulse: 74 88  Resp: 16 16  Temp: 98.4 F (36.9 C) 98.9 F (37.2 C)  SpO2: 100% 100%    Exam: Alert younger than stated age Chest clear Abdomen soft bandage over right lower quadrant Neurologically intact moving all 4 limbs Range of motion intact power 5/5  I have personally reviewed the following:   Today's Data  . Potassium 2.9 albumin  3.2 white count 11.3   Scheduled Meds: . heparin  5,000 Units Subcutaneous Q8H  . levothyroxine  25 mcg Intravenous Q0600  . metoprolol tartrate  2.5 mg Intravenous Q8H  . sodium chloride flush  3 mL Intravenous Once   Continuous Infusions: . 0.9 % NaCl with KCl 40 mEq / L    . magnesium sulfate bolus IVPB      Principal Problem:   Incarcerated femoral hernia Active Problems:   SBO (small bowel obstruction) (HCC)   Leukocytosis   Bladder distention   Femoral hernia of right side   Essential hypertension   Hypothyroidism   Malnutrition of moderate degree   LOS: 2 days   How to contact the [facility] Attending or Consulting provider [place] or covering provider during after hours [facility], for this patient?  1. Check the care team in [facility] and look for a) attending/consulting [facility] provider listed and b) the [facility] team listed 2. Log into [email or web address] and use [facility]'s universal password to access. If you do not have the password, please contact the hospital operator. 3. Locate the [facility] provider you are looking for under [facility] and page to a number that you can be directly reached. 4. If you still have difficulty reaching the provider, please page the [facility] (Director on Call) for the Hospitalists listed on amion for assistance.

## 2019-07-31 LAB — COMPREHENSIVE METABOLIC PANEL
ALT: 14 U/L (ref 0–44)
AST: 16 U/L (ref 15–41)
Albumin: 2.7 g/dL — ABNORMAL LOW (ref 3.5–5.0)
Alkaline Phosphatase: 56 U/L (ref 38–126)
Anion gap: 9 (ref 5–15)
BUN: 10 mg/dL (ref 8–23)
CO2: 26 mmol/L (ref 22–32)
Calcium: 7.9 mg/dL — ABNORMAL LOW (ref 8.9–10.3)
Chloride: 105 mmol/L (ref 98–111)
Creatinine, Ser: 0.43 mg/dL — ABNORMAL LOW (ref 0.44–1.00)
GFR calc Af Amer: >60 mL/min (ref >60)
GFR calc non Af Amer: >60 mL/min (ref >60)
Glucose, Bld: 108 mg/dL — ABNORMAL HIGH (ref 70–99)
Potassium: 3.9 mmol/L (ref 3.5–5.1)
Sodium: 140 mmol/L (ref 135–145)
Total Bilirubin: 0.4 mg/dL (ref 0.3–1.2)
Total Protein: 5.9 g/dL — ABNORMAL LOW (ref 6.5–8.1)

## 2019-07-31 LAB — MAGNESIUM: Magnesium: 2.1 mg/dL (ref 1.7–2.4)

## 2019-07-31 MED ORDER — IBUPROFEN 200 MG PO TABS: ORAL_TABLET | ORAL | Status: DC

## 2019-07-31 MED ORDER — TRAMADOL HCL 50 MG PO TABS: 50.0000 mg | ORAL_TABLET | Freq: Four times a day (QID) | ORAL | 0 refills | Status: DC | PRN

## 2019-07-31 MED ORDER — ACETAMINOPHEN 500 MG PO TABS: ORAL_TABLET | ORAL | 0 refills | Status: AC

## 2019-07-31 NOTE — Plan of Care (Signed)
Assessment unchanged. Will Creig Hines, PA, gave dc instructions this morning Via interpreter with nurse at bedside, and pt verbalized understanding through teach back. Reiterated instructions when daughter arrived. No questions. Understands when to call he doctor. Discharged via wc to front entrance accompanied by NT and daughter.

## 2019-07-31 NOTE — Care Management Important Message (Signed)
Important Message  Patient Details IM Letter given to Velva Harman RN to present to the Patient Name: Leslie Fuller MRN: [id number] Date of Birth: 1941-11-14   Medicare Important Message Given:  Yes     Kerin Salen 07/31/2019, 12:04 PM

## 2019-07-31 NOTE — Progress Notes (Signed)
2 Days Post-Op    CC:  Subjective: She looks good, not taking much, but she does not eat a lot.  Wound looks fine, and is healing nicely.  Abdomen is soft and passing flatus, no BM so far.    Objective: Vital signs in last 24 hours: Temp:  [98.1 F (36.7 C)-99.5 F (37.5 C)] 98.2 F (36.8 C) (08/27 0628) Pulse Rate:  [83-93] 88 (08/27 0628) Resp:  [14-18] 16 (08/27 0628) BP: (102-136)/(58-76) 130/70 (08/27 0628) SpO2:  [96 %-99 %] 99 % (08/27 0628) Last BM Date: (unable to provide) 480 PO 1900 IV Urine 2300 No BM Afebrile, VSS  AM labs pending Intake/Output  from previous day: 08/26 0701 - 08/27 0700 In: 2369.7 [P.O.:480; I.V.:1889.7] Out: 2500 [Urine:2300; Emesis/NG output:200] Intake/Output this shift: No intake/output data recorded.  General appearance: alert, cooperative and no distress Resp: clear to auscultation bilaterally GI: soft nontender, not distended, sore from site, but it looks fine.    Lab Results:  Recent Labs    07/29/19 0322 07/30/19 0250  WBC 8.3 11.3*  HGB 13.5 12.7  HCT 42.4 40.8  PLT 240 197    BMET Recent Labs    07/29/19 0641 07/30/19 0250  NA 142 136  K 3.8 2.9*  CL 106 100  CO2 28 26  GLUCOSE 100* 71  BUN 20 17  CREATININE 0.64 0.54  CALCIUM 8.7* 8.2*   PT/INR No results for input(s): LABPROT, INR in the last 72 hours.  Recent Labs  Lab 07/28/19 1948 07/30/19 0250  AST 30 22  ALT 21 16  ALKPHOS 103 71  BILITOT 0.9 1.2  PROT 8.5* 6.3*  ALBUMIN 4.4 3.2*     Lipase     Component Value Date/Time   LIPASE 34 07/28/2019 1948     Medications: . acetaminophen  1,000 mg Oral Q8H  . heparin  5,000 Units Subcutaneous Q8H  . levothyroxine  25 mcg Intravenous Q0600  . metoprolol tartrate  2.5 mg Intravenous Q8H  . sodium chloride flush  3 mL Intravenous Once    Assessment/Plan Hypertension Thyroid disease Anxiety COVID is negative Hypokalemia -K+ 2.9   INCARCERATED FEMORAL HERNIA / SMAL BOWEL  OBSTRUCTION RIGHT FEMORAL HERNIA REPAIR WITH MESH (Right), 07/29/19, Dr. Mamie Nick TothIII POD#2  FEN:  soft diet/ IV fluids [id number] preop DVT:  SCD - Heparin Follow up:  Dr. Marlou Starks   Plan:  Home today.  We discussed instructions in detail with [name] interpreter [phone].        LOS: 3 days    Cathrine Krizan 07/31/2019 [phone]

## 2019-07-31 NOTE — Discharge Summary (Signed)
Physician Discharge Summary  Leslie MannersMaria Fuller ZOX:[id number] DOB: 1941-03-04 DOA: 07/28/2019  PCP: No primary care provider on file.  Admit date: 07/28/2019 Discharge date: 07/31/2019  Time spent: 24 minutes  Recommendations for Outpatient Follow-up:  1. Postop care pain management etc. as per general surgeon see their note 2. Follow-up with primary care physician when able for labs in about 1 to 2 weeks or as her convenience  Discharge Diagnoses:  Principal Problem:   Incarcerated femoral hernia Active Problems:   SBO (small bowel obstruction) (HCC)   Leukocytosis   Bladder distention   Femoral hernia of right side   Essential hypertension   Hypothyroidism   Malnutrition of moderate degree   Discharge Condition: Improved  Diet recommendation: Soft  Filed Weights   07/28/19 1649 07/29/19 0145  Weight: 36.5 kg 36.8 kg    History of present illness:  78 year old [place] female no significant medical history other than mild anxiety thyroid disease hypertension She had a previous abdominal hysterectomy in the distant past-was [facility] med [facility] on 8/25 early a.m. and [facility] to [facility]: Found to have a possible incarcerated right femoral hernia with associated SBO Taken emergently for surgery 8/25 later in the day she did not have on operative findings an incarcerated hernia no bowel was found in the area however mesh was placed  She had moderate to severe hypokalemia day post discharge and this was replaced and IV fluids and resolved to 3.9 on discharge All of her home meds including Cardizem 240 thyroxine 50 and your other medications were resumed on discharge  She was felt to be stabilized from general surgeon's care to discharge home on 8/27 with their precautions She will follow-up with them   Procedures: Right femoral hernia repair with mesh 8/24 2020 Dr. Chevis PrettyPaul Toth  Consultations:  General surgery  Discharge Exam: Vitals:   07/30/19 2103 07/31/19  0628  BP: 102/76 130/70  Pulse: 93 88  Resp: 18 16  Temp: 99.5 F (37.5 C) 98.2 F (36.8 C)  SpO2: 98% 99%    General: Awake alert coherent no distress EOMI NCAT Cardiovascular: S1-S2 no murmur rub or gallop Respiratory: Chest clear Abdomen soft right lower quadrant scar mild tenderness No lower extremity edema no rash  Discharge Instructions   Discharge Instructions    Diet - low sodium heart healthy   Complete by: As directed    Discharge instructions   Complete by: As directed    Make sure you get lab work checked within the next week Please follow-up with primary care physician at around that time Surgeons will follow you up as per their discretion-please follow their postoperative instructions Go slow on your diet   Increase activity slowly   Complete by: As directed      Allergies as of 07/31/2019   No Known Allergies     Medication List    TAKE these medications   acetaminophen 500 MG tablet Commonly known as: TYLENOL You can take 1000 mg every 8 hours as needed for pain.  You can also alternate plain Tylenol/acetominophin with ibuprofen. Start the ibuprofen about 2 hours after you take the Tylenol for additional pain relief.  You can buy this over the counter at any drug store.   calcium-vitamin D 500-200 MG-UNIT tablet Commonly known as: OSCAL WITH D Take 1 tablet by mouth daily with breakfast.   diltiazem 240 MG 24 hr capsule Commonly known as: CARDIZEM CD Take by mouth.   ibuprofen 200 MG tablet Commonly known  as: ADVIL You can take 2 tablets every 6 hours as needed for pain relief.  You can alternate this with plain Tylenol/acetaminophen.  You can buy this over the counter at any drug store.   levothyroxine 50 MCG tablet Commonly known as: SYNTHROID Take 50 mcg by mouth See admin instructions. Take one tablet daily except for Sundays.   LORazepam 1 MG tablet Commonly known as: ATIVAN Take 1 mg by mouth at bedtime.   pravastatin 10 MG  tablet Commonly known as: PRAVACHOL Take 10 mg by mouth at bedtime.   sertraline 100 MG tablet Commonly known as: ZOLOFT Take 150 mg by mouth daily.   traMADol 50 MG tablet Commonly known as: ULTRAM Take 1 tablet (50 mg total) by mouth every 6 (six) hours as needed for moderate pain or severe pain (pain not relieved by PO tylenol and ibuprofen).   ZINC SULFATE PO Take 2 capsules by mouth daily.      No Known Allergies Follow-up Information    Chevis Pretty III, MD Follow up on 08/14/2019.   Specialty: General Surgery Why: Your appointment is at 2:30 PM.  Be at the office 30 minutes early for check in.  Bring someone who speak English if you can.  Wear a mask, bring photo ID and insurance information. Contact information: [address] [place] [address] [phone]            The results of significant diagnostics from this hospitalization (including imaging, microbiology, ancillary and laboratory) are listed below for reference.    Significant Diagnostic Studies: Dg Abdomen 1 View  Result Date: 07/28/2019 CLINICAL DATA:  NG tube placement EXAM: ABDOMEN - 1 VIEW COMPARISON:  CT earlier today FINDINGS: NG tube coils in the fundus of the stomach. There is an elongated stomach with moderate gaseous distention. Dilated small bowel loops compatible with small bowel obstruction. No free air or organomegaly. IMPRESSION: NG tube tip coils in the fundus of the stomach. Dilated, elongated stomach. Electronically Signed   By: Charlett Nose M.D.   On: 07/28/2019 23:20   Ct Abdomen Pelvis W Contrast  Result Date: 07/28/2019 CLINICAL DATA:  Abdominal pain with vomiting EXAM: CT ABDOMEN AND PELVIS WITH CONTRAST TECHNIQUE: Multidetector CT imaging of the abdomen and pelvis was performed using the standard protocol following bolus administration of intravenous contrast. CONTRAST:  OMNIPAQUE IOHEXOL 300 MG/ML  SOLN COMPARISON:  None. FINDINGS: Lower chest: Lung bases  demonstrate no acute consolidation or effusion. The heart size is within normal limits. Hepatobiliary: No focal hepatic abnormality. Calcified gallstones. No biliary dilatation Pancreas: Unremarkable. No pancreatic ductal dilatation or surrounding inflammatory changes. Spleen: Normal in size without focal abnormality. Adrenals/Urinary Tract: Adrenal glands are normal. Cysts within the bilateral kidneys. Mild bilateral hydronephrosis and proximal hydroureter. Bladder is markedly distended. Stomach/Bowel: Stomach is moderately enlarged. Multiple dilated loops of mid to distal small bowel measuring up to 3.3 cm in diameter. Transition point appears to be related to a right groin hernia, suspected to represent a femoral hernia. There is mild fluid and stranding about the right groin hernia. There is no colon wall thickening Vascular/Lymphatic: Moderate aortic atherosclerosis. No aneurysm. No significantly enlarged lymph nodes Reproductive: Status post hysterectomy. No adnexal masses. Other: No free air. Small volume of ascites surrounding the liver and within the bilateral colic gutters. Musculoskeletal: Degenerative changes of the spine. No acute osseous abnormality. Probable nerve root cysts at the sacrum. Fluid density right presacral structure appears to be contiguous with right lower  sacral foramen. IMPRESSION: 1. Findings consistent with mechanical small bowel obstruction, transition point appears to be related to a right groin hernia, location and configuration suggests possible femoral hernia. There is some fluid and edema within the hernia sac. 2. Small amount of ascites within the abdomen but negative for free air 3. Gallstones 4. Mild bilateral hydronephrosis and proximal hydroureter, probably secondary to over distention of the bladder Electronically Signed   By: Jasmine PangKim  Fujinaga M.D.   On: 07/28/2019 21:58    Microbiology: Recent Results (from the past 240 hour(s))  SARS CORONAVIRUS 2 (TAT 6-12 HRS) Nasal  Swab Aptima Multi Swab     Status: None   Collection Time: 07/28/19 10:36 PM   Specimen: Aptima Multi Swab; Nasal Swab  Result Value Ref Range Status   SARS Coronavirus 2 NEGATIVE NEGATIVE Final    Comment: (NOTE) SARS-CoV-2 target nucleic acids are NOT DETECTED. The SARS-CoV-2 RNA is generally detectable in upper and lower respiratory specimens during the acute phase of infection. Negative results do not preclude SARS-CoV-2 infection, do not rule out co-infections with other pathogens, and should not be used as the sole basis for treatment or other patient management decisions. Negative results must be combined with clinical observations, patient history, and epidemiological information. The expected result is Negative. Fact Sheet for Patients: HairSlick.nohttps://www.fda.gov/media/138098/download Fact Sheet for Healthcare Providers: quierodirigir.comhttps://www.fda.gov/media/138095/download This test is not yet approved or cleared by the [place] FDA and  has been authorized for detection and/or diagnosis of SARS-CoV-2 by FDA under an Emergency Use Authorization (EUA). This EUA will remain  in effect (meaning this test can be used) for the duration of the COVID-19 declaration under Section 56 4(b)(1) of the Act, 21 U.S.C. section 360bbb-3(b)(1), unless the authorization is terminated or revoked sooner. Performed at [facility], [address] [place], [place] [address]   Culture, Urine     Status: None   Collection Time: 07/29/19  8:26 AM   Specimen: Urine, Clean Catch  Result Value Ref Range Status   Specimen Description   Final    URINE, CLEAN CATCH Performed at [facility], [address] [place], [place] [address]    Special Requests   Final    NONE Performed at [facility], [address] [place], [place] [address]    Culture   Final    NO GROWTH Performed at [facility], [address] [place], [place] [address]    Report Status  07/30/2019 FINAL  Final     Labs: Basic Metabolic Panel: Recent Labs  Lab 07/28/19 1948 07/29/19 0641 07/30/19 0250 07/31/19 0802  NA 136 142 136 140  K 4.3 3.8 2.9* 3.9  CL 96* 106 100 105  CO2 27 28 26 26   GLUCOSE 129* 100* 71 108*  BUN 21 20 17 10   CREATININE 0.68 0.64 0.54 0.43*  CALCIUM 9.9 8.7* 8.2* 7.9*  MG  --   --  1.9 2.1   Liver Function Tests: Recent Labs  Lab 07/28/19 1948 07/30/19 0250 07/31/19 0802  AST 30 22 16   ALT 21 16 14   ALKPHOS 103 71 56  BILITOT 0.9 1.2 0.4  PROT 8.5* 6.3* 5.9*  ALBUMIN 4.4 3.2* 2.7*   Recent Labs  Lab 07/28/19 1948  LIPASE 34   No results for input(s): AMMONIA in the last 168 hours. CBC: Recent Labs  Lab 07/28/19 1948 07/29/19 0322 07/30/19 0250  WBC 11.8* 8.3 11.3*  NEUTROABS  --   --  10.3*  HGB 15.2* 13.5 12.7  HCT 46.6* 42.4 40.8  MCV 90.7 93.6 94.0  PLT 298 240 197   Cardiac Enzymes: No results for input(s): CKTOTAL, CKMB, CKMBINDEX, TROPONINI in the last 168 hours. BNP: BNP (last 3 results) No results for input(s): BNP in the last 8760 hours.  ProBNP (last 3 results) No results for input(s): PROBNP in the last 8760 hours.  CBG: No results for input(s): GLUCAP in the last 168 hours.     Signed:  Nita Sells MD   [facility] 07/31/2019, 9:39 AM

## 2020-04-07 DIAGNOSIS — K5909 Other constipation: Secondary | ICD-10-CM | POA: Insufficient documentation

## 2020-04-19 DIAGNOSIS — L209 Atopic dermatitis, unspecified: Secondary | ICD-10-CM | POA: Insufficient documentation

## 2020-04-21 ENCOUNTER — Emergency Department (HOSPITAL_COMMUNITY): Payer: Medicare Other

## 2020-04-21 ENCOUNTER — Encounter (HOSPITAL_COMMUNITY): Payer: Self-pay | Admitting: Emergency Medicine

## 2020-04-21 ENCOUNTER — Emergency Department (HOSPITAL_COMMUNITY)
Admission: EM | Admit: 2020-04-21 | Discharge: 2020-04-21 | Disposition: A | Discharge status: Home / Self Care | Payer: Medicare Other | Attending: Emergency Medicine | Admitting: Emergency Medicine

## 2020-04-21 ENCOUNTER — Other Ambulatory Visit: Payer: Self-pay

## 2020-04-21 DIAGNOSIS — K625 Hemorrhage of anus and rectum: Secondary | ICD-10-CM | POA: Diagnosis not present

## 2020-04-21 DIAGNOSIS — R1030 Lower abdominal pain, unspecified: Secondary | ICD-10-CM | POA: Diagnosis not present

## 2020-04-21 DIAGNOSIS — Z79899 Other long term (current) drug therapy: Secondary | ICD-10-CM | POA: Insufficient documentation

## 2020-04-21 DIAGNOSIS — E039 Hypothyroidism, unspecified: Secondary | ICD-10-CM | POA: Diagnosis not present

## 2020-04-21 DIAGNOSIS — K5900 Constipation, unspecified: Secondary | ICD-10-CM | POA: Diagnosis present

## 2020-04-21 DIAGNOSIS — I10 Essential (primary) hypertension: Secondary | ICD-10-CM | POA: Diagnosis not present

## 2020-04-21 DIAGNOSIS — R110 Nausea: Secondary | ICD-10-CM | POA: Insufficient documentation

## 2020-04-21 DIAGNOSIS — K529 Noninfective gastroenteritis and colitis, unspecified: Secondary | ICD-10-CM | POA: Diagnosis not present

## 2020-04-21 LAB — COMPREHENSIVE METABOLIC PANEL
ALT: 17 U/L (ref 0–44)
AST: 20 U/L (ref 15–41)
Albumin: 4.1 g/dL (ref 3.5–5.0)
Alkaline Phosphatase: 77 U/L (ref 38–126)
Anion gap: 11 (ref 5–15)
BUN: 10 mg/dL (ref 8–23)
CO2: 27 mmol/L (ref 22–32)
Calcium: 9.6 mg/dL (ref 8.9–10.3)
Chloride: 102 mmol/L (ref 98–111)
Creatinine, Ser: 0.4 mg/dL — ABNORMAL LOW (ref 0.44–1.00)
GFR calc Af Amer: >60 mL/min (ref >60)
GFR calc non Af Amer: >60 mL/min (ref >60)
Glucose, Bld: 108 mg/dL — ABNORMAL HIGH (ref 70–99)
Potassium: 4 mmol/L (ref 3.5–5.1)
Sodium: 140 mmol/L (ref 135–145)
Total Bilirubin: 0.7 mg/dL (ref 0.3–1.2)
Total Protein: 7.5 g/dL (ref 6.5–8.1)

## 2020-04-21 LAB — POC OCCULT BLOOD, ED: Fecal Occult Bld: POSITIVE — AB

## 2020-04-21 LAB — CBC
HCT: 43.9 % (ref 36.0–46.0)
Hemoglobin: 14.4 g/dL (ref 12.0–15.0)
MCH: 30.5 pg (ref 26.0–34.0)
MCHC: 32.8 g/dL (ref 30.0–36.0)
MCV: 93 fL (ref 80.0–100.0)
Platelets: 248 10*3/uL (ref 150–400)
RBC: 4.72 MIL/uL (ref 3.87–5.11)
RDW: 13.1 % (ref 11.5–15.5)
WBC: 5.9 10*3/uL (ref 4.0–10.5)
nRBC: 0 % (ref 0.0–0.2)

## 2020-04-21 LAB — TYPE AND SCREEN
ABO/RH(D): O NEG
Antibody Screen: NEGATIVE

## 2020-04-21 MED ORDER — METRONIDAZOLE 500 MG PO TABS
500.0000 mg | ORAL_TABLET | Freq: Once | ORAL | Status: AC
  Administered 2020-04-21: 500 mg via ORAL
  Filled 2020-04-21: qty 1

## 2020-04-21 MED ORDER — CIPROFLOXACIN HCL 500 MG PO TABS
500.0000 mg | ORAL_TABLET | Freq: Once | ORAL | Status: AC
  Administered 2020-04-21: 500 mg via ORAL
  Filled 2020-04-21: qty 1

## 2020-04-21 MED ORDER — CIPROFLOXACIN HCL 500 MG PO TABS: 500.0000 mg | ORAL_TABLET | Freq: Two times a day (BID) | ORAL | 0 refills | Status: DC

## 2020-04-21 MED ORDER — METRONIDAZOLE 500 MG PO TABS: 500.0000 mg | ORAL_TABLET | Freq: Two times a day (BID) | ORAL | 0 refills | Status: DC

## 2020-04-21 MED ORDER — IOHEXOL 9 MG/ML PO SOLN
ORAL | Status: AC
  Filled 2020-04-21: qty 1000

## 2020-04-21 MED ORDER — IOHEXOL 300 MG/ML  SOLN
100.0000 mL | Freq: Once | INTRAMUSCULAR | Status: AC | PRN
  Administered 2020-04-21: 100 mL via INTRAVENOUS

## 2020-04-21 NOTE — ED Triage Notes (Signed)
Per EMS-has not eaten since yesterday-states blood in the stool since yesterday-language barrier

## 2020-04-21 NOTE — ED Provider Notes (Signed)
[facility] DEPT Provider Note   CSN: [id number] Arrival date & time: 04/21/20  1232     History No chief complaint on file.   Leslie Fuller is a 79 y.o. female.  HPI She presents for evaluation of constipation with abdominal pain and swelling, and rectal bleeding.  Onset of the symptoms yesterday.  She took MiraLAX and had a large volume stool and after that developed some rectal bleeding.  She has not been hungry for 2 days or eaten much.  Today she passed a small amount of stool with more blood so decided come here for evaluation.  She has chronic intermittent constipation for which she uses MiraLAX.  She denies fever, chills, vomiting or dizziness.  She has had some nausea.  There is been no cough, chest pain, or difficulty walking.  No known sick contacts.  History of internal abdominal hernia requiring surgical repair.  There are no other known modifying factors.    Past Medical History:  Diagnosis Date  . Anxiety   . Hypertension   . Thyroid disease     Patient Active Problem List   Diagnosis Date Noted  . Leukocytosis 07/29/2019  . Bladder distention 07/29/2019  . Femoral hernia of right side 07/29/2019  . Incarcerated femoral hernia 07/29/2019  . Essential hypertension 07/29/2019  . Hypothyroidism 07/29/2019  . Malnutrition of moderate degree 07/29/2019  . SBO (small bowel obstruction) ([place]) 07/28/2019    Past Surgical History:  Procedure Laterality Date  . ABDOMINAL HYSTERECTOMY    . INGUINAL HERNIA REPAIR Right 07/29/2019   Procedure: RIGHT FEMORAL HERNIA REPAIR WITH MESH;  Surgeon: Jovita Kussmaul, MD;  Location: [facility] ORS;  Service: General;  Laterality: Right;  . THYROIDECTOMY       OB History   No obstetric history on file.     Family History  Problem Relation Age of Onset  . Heart attack Father     Social History   Tobacco Use  . Smoking status: Never Smoker  . Smokeless tobacco: Never Used  Substance Use Topics  .  Alcohol use: Never  . Drug use: Never    Home Medications Prior to Admission medications   Medication Sig Start Date End Date Taking? Authorizing Provider  acetaminophen (TYLENOL) 500 MG tablet You can take 1000 mg every 8 hours as needed for pain.  You can also alternate plain Tylenol/acetominophin with ibuprofen. Start the ibuprofen about 2 hours after you take the Tylenol for additional pain relief.  You can buy this over the counter at any drug store. 07/31/19   Earnstine Regal, PA-C  calcium-vitamin D (OSCAL WITH D) 500-200 MG-UNIT tablet Take 1 tablet by mouth daily with breakfast.    [provider]  ciprofloxacin (CIPRO) 500 MG tablet Take 1 tablet (500 mg total) by mouth 2 (two) times daily. One po bid x 7 days 04/21/20   Daleen Bo, MD  diltiazem (CARDIZEM CD) 240 MG 24 hr capsule Take by mouth. 11/22/16 09/03/19  [provider]  ibuprofen (ADVIL) 200 MG tablet You can take 2 tablets every 6 hours as needed for pain relief.  You can alternate this with plain Tylenol/acetaminophen.  You can buy this over the counter at any drug store. 07/31/19   Earnstine Regal, PA-C  levothyroxine (SYNTHROID) 50 MCG tablet Take 50 mcg by mouth See admin instructions. Take one tablet daily except for Sundays. 06/16/17 09/03/19  [provider]  LORazepam (ATIVAN) 1 MG tablet Take 1 mg by mouth at  bedtime.  06/05/19 06/05/20  [provider]  metroNIDAZOLE (FLAGYL) 500 MG tablet Take 1 tablet (500 mg total) by mouth 2 (two) times daily. One po bid x 7 days 04/21/20   Mancel Bale, MD  pravastatin (PRAVACHOL) 10 MG tablet Take 10 mg by mouth at bedtime.  06/13/17 09/03/19  [provider]  sertraline (ZOLOFT) 100 MG tablet Take 150 mg by mouth daily.  06/16/17   [provider]  traMADol (ULTRAM) 50 MG tablet Take 1 tablet (50 mg total) by mouth every 6 (six) hours as needed for moderate pain or severe pain (pain not relieved by PO tylenol and ibuprofen).  07/31/19   Sherrie George, PA-C  ZINC SULFATE PO Take 2 capsules by mouth daily.    [provider]    Allergies    Patient has no known allergies.  Review of Systems   Review of Systems  All other systems reviewed and are negative.   Physical Exam Updated Vital Signs BP (!) 169/88   Pulse 80   Temp 98.3 F (36.8 C) (Oral)   Resp 18   SpO2 99%   Physical Exam Vitals and nursing note reviewed.  Constitutional:      Appearance: She is well-developed.  HENT:     Head: Normocephalic and atraumatic.     Right Ear: External ear normal.     Left Ear: External ear normal.     Mouth/Throat:     Pharynx: No oropharyngeal exudate or posterior oropharyngeal erythema.  Eyes:     Conjunctiva/sclera: Conjunctivae normal.     Pupils: Pupils are equal, round, and reactive to light.  Neck:     Trachea: Phonation normal.  Cardiovascular:     Rate and Rhythm: Normal rate and regular rhythm.     Heart sounds: Normal heart sounds.  Pulmonary:     Effort: Pulmonary effort is normal.     Breath sounds: Normal breath sounds.  Abdominal:     General: There is distension.     Palpations: Abdomen is soft.     Tenderness: There is abdominal tenderness (Lower, bilateral, mild).  Genitourinary:    Comments: Normal anus, no external hemorrhoid or fissure.  On digital rectal examination there is a small amount of brown stool in rectal vault.  There is no rectal mass, apparent blood or tenderness on exam. Musculoskeletal:        General: Normal range of motion.     Cervical back: Normal range of motion and neck supple.  Skin:    General: Skin is warm and dry.  Neurological:     Mental Status: She is alert and oriented to person, place, and time.     Cranial Nerves: No cranial nerve deficit.     Sensory: No sensory deficit.     Motor: No abnormal muscle tone.     Coordination: Coordination normal.  Psychiatric:        Mood and Affect: Mood normal.        Behavior: Behavior  normal.        Thought Content: Thought content normal.        Judgment: Judgment normal.     ED Results / Procedures / Treatments   Labs (all labs ordered are listed, but only abnormal results are displayed) Labs Reviewed  COMPREHENSIVE METABOLIC PANEL - Abnormal; Notable for the following components:      Result Value   Glucose, Bld 108 (*)    Creatinine, Ser 0.40 (*)    All  other components within normal limits  POC OCCULT BLOOD, ED - Abnormal; Notable for the following components:   Fecal Occult Bld POSITIVE (*)    All other components within normal limits  CBC  TYPE AND SCREEN    EKG None  Radiology CT Abdomen Pelvis W Contrast  Result Date: 04/21/2020 CLINICAL DATA:  Blood in stool, abdominal distension EXAM: CT ABDOMEN AND PELVIS WITH CONTRAST TECHNIQUE: Multidetector CT imaging of the abdomen and pelvis was performed using the standard protocol following bolus administration of intravenous contrast. CONTRAST:  OMNIPAQUE IOHEXOL 300 MG/ML  SOLN COMPARISON:  07/28/2019 FINDINGS: Lower chest: No acute pleural or parenchymal lung disease. Hepatobiliary: Calcified gallstones are unchanged. No evidence of acute cholecystitis. The liver is unremarkable. Pancreas: Unremarkable. No pancreatic ductal dilatation or surrounding inflammatory changes. Spleen: Normal in size without focal abnormality. Adrenals/Urinary Tract: Bilateral renal cortical cysts are unchanged. No urinary tract calculi or obstructive uropathy. The adrenals are grossly normal. The bladder is minimally distended with no focal abnormalities. Stomach/Bowel: No bowel obstruction or ileus. There is wall thickening of the descending and sigmoid colon most compatible with inflammatory or infectious colitis. Vascular/Lymphatic: Aortic atherosclerosis. No enlarged abdominal or pelvic lymph nodes. Reproductive: Uterus is surgically absent. There is a stable simple appearing left adnexal cyst measuring 3 cm. No right adnexal  lesions. Other: No free fluid or free gas. Postsurgical changes from right inguinal hernia repair. No recurrent hernia. Musculoskeletal: No acute or destructive bony lesions. Reconstructed images demonstrate no additional findings. IMPRESSION: 1. Wall thickening of the descending and sigmoid colon most compatible with inflammatory or infectious colitis. 2. Cholelithiasis without evidence of acute cholecystitis. 3. Stable 3 cm left adnexal cyst. 4. Aortic Atherosclerosis (ICD10-I70.0). Electronically Signed   By: Sharlet Salina M.D.   On: 04/21/2020 20:02    Procedures Procedures (including critical care time)  Medications Ordered in ED Medications  iohexol (OMNIPAQUE) 9 MG/ML oral solution (has no administration in time range)  iohexol (OMNIPAQUE) 300 MG/ML solution 100 mL (100 mLs Intravenous Contrast Given 04/21/20 1938)  ciprofloxacin (CIPRO) tablet 500 mg (500 mg Oral Given 04/21/20 2057)  metroNIDAZOLE (FLAGYL) tablet 500 mg (500 mg Oral Given 04/21/20 2057)    ED Course  I have reviewed the triage vital signs and the nursing notes.  Pertinent labs & imaging results that were available during my care of the patient were reviewed by me and considered in my medical decision making (see chart for details).  Clinical Course as of Apr 21 2099  Wed Apr 21, 2020  2021 Blood present  POC occult blood, ED(!) [EW]  2021 Normal except glucose high, creatinine low  Comprehensive metabolic panel(!) [EW]  2022 Normal  CBC [EW]  2023 Per radiologist, images consistent with inflammation or infection of the descending and sigmoid colon.  CT Abdomen Pelvis W Contrast [EW]    Clinical Course User Index [EW] Mancel Bale, MD   MDM Rules/Calculators/A&P                       Patient Vitals for the past 24 hrs:  BP Temp Temp src Pulse Resp SpO2  04/21/20 1900 (!) 169/88 - - 80 18 99 %  04/21/20 1246 (!) 173/83 98.3 F (36.8 C) Oral 82 16 98 %    8:53 PM Reevaluation with update and  discussion. After initial assessment and treatment, an updated evaluation reveals she remains comfortable.  Findings discussed in depth with patient and daughter, all questions answered. Mancel Bale  Medical Decision Making:  This patient is presenting for evaluation of abdominal pain and rectal bleeding, which does require a range of treatment options, and is a complaint that involves a high risk of morbidity and mortality. The differential diagnoses include gastroenteritis, colitis, gastrointestinal tumor, constipation. I decided to review old records, and in summary elderly female, with a remote colonoscopy, 20 years ago, and history of small bowel obstruction requiring surgical repair..  I obtain additional historical information from her daughter at the bedside.  Clinical Laboratory Tests Ordered, included CBC and Metabolic panel. Review indicates normal tests. Radiologic Tests Ordered, included CT abdomen pelvis.  I independently Visualized: CT images, which show constipation and colitis   Critical Interventions-clinical evaluation, laboratory testing, CT imaging, observation reassessment  After These Interventions, the Patient was reevaluated and was found stable for discharge.  Evaluation consistent with nonspecific colitis.  Doubt ischemic bowel disease.  Doubt bowel obstruction.  Incidental constipation not currently problematic.    CRITICAL CARE-no Performed by: Mancel Bale  Nursing Notes Reviewed/ Care Coordinated Applicable Imaging Reviewed Interpretation of Laboratory Data incorporated into ED treatment  The patient appears reasonably screened and/or stabilized for discharge and I doubt any other medical condition or other [facility] requiring further screening, evaluation, or treatment in the ED at this time prior to discharge.  Plan: Home Medications-continue current medication; Home Treatments-low fiber diet for now, until over this current illness then resume high-fiber  diet; return here if the recommended treatment, does not improve the symptoms; Recommended follow up-PCP follow-up 1 week and as needed     Final Clinical Impression(s) / ED Diagnoses Final diagnoses:  Colitis  Constipation, unspecified constipation type    Rx / DC Orders ED Discharge Orders         Ordered    metroNIDAZOLE (FLAGYL) 500 MG tablet  2 times daily     04/21/20 2059    ciprofloxacin (CIPRO) 500 MG tablet  2 times daily     04/21/20 2059           Mancel Bale, MD 04/21/20 2100

## 2020-04-21 NOTE — ED Notes (Signed)
Pt dressed in gown. POC sample and lubrication at bedside

## 2020-04-21 NOTE — ED Notes (Signed)
Pt verbalizes understanding of DC instructions. Pt belongings returned and is ambulatory out of ED.  

## 2020-04-21 NOTE — Discharge Instructions (Addendum)
Stay on a simple, low fiber diet at this time.  Make sure you are drinking plenty of water.  Follow-up with your PCP for checkup next week.  Start the antibiotic prescriptions tomorrow morning.

## 2020-04-21 NOTE — ED Triage Notes (Signed)
Used [employer] [place] interpreter  abd pains starting last night that was intense. Has constipation problems. This morning noticing blood mixed with her stools. Was having to strain to have BMs.  Reports having intermittent pains now and when pain comes on needs to go to bathroom.

## 2020-07-25 IMAGING — CT CT ABD-PELV W/ CM
2 of 5 series · 16 of 46 positions shown, 18 images · IV contrast (omnipaque)
Comparison: 07/28/2019

CLINICAL DATA: Blood in stool, abdominal distension

EXAM:
CT ABDOMEN AND PELVIS WITH CONTRAST
TECHNIQUE: Multidetector CT imaging of the abdomen and pelvis was performed
using the standard protocol following bolus administration of
intravenous contrast.
CONTRAST:  100mL OMNIPAQUE IOHEXOL 300 MG/ML  SOLN

[Series 2: axial st · axial · 0.72mm/px · z∈[-495,-120]mm · 13 of 89 slices shown, 15 images]
[im 7/89  soft-tissue]
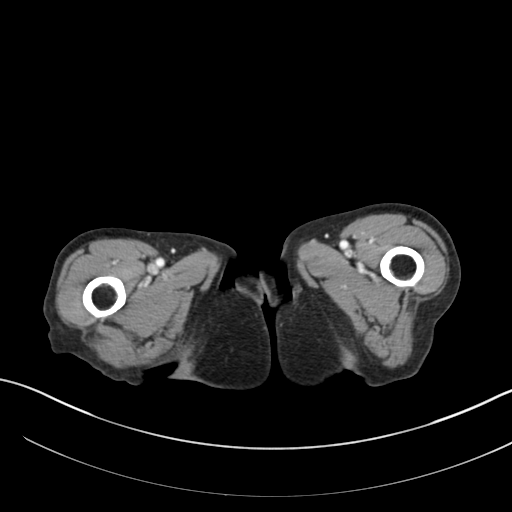
[im 7/89  bone]
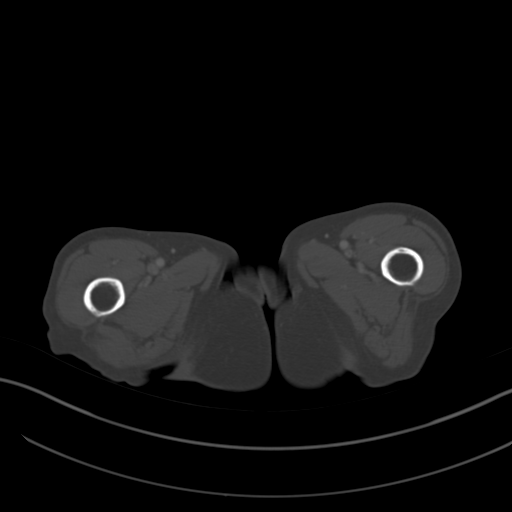
[im 13/89  soft-tissue]
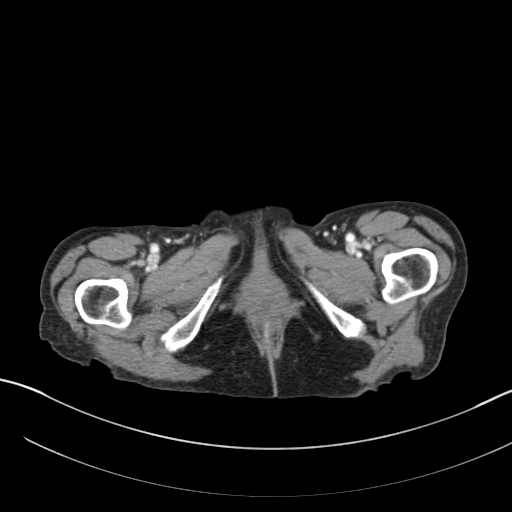
[im 19/89  soft-tissue]
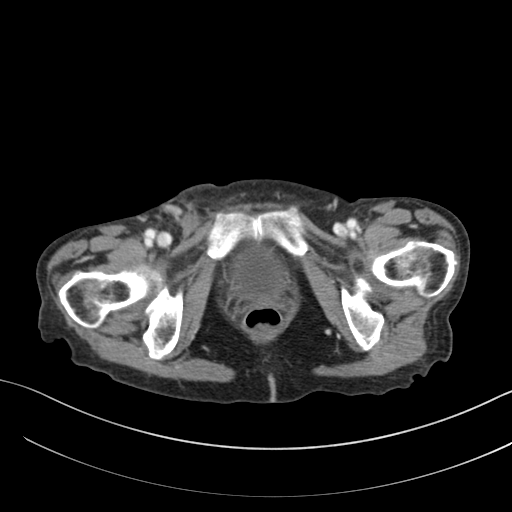
[im 26/89  soft-tissue]
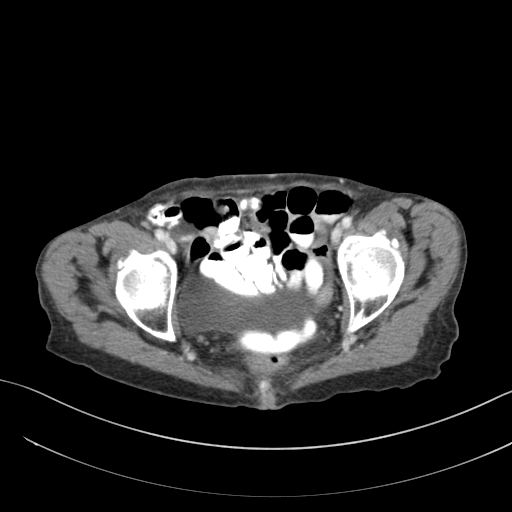
[im 32/89  soft-tissue]
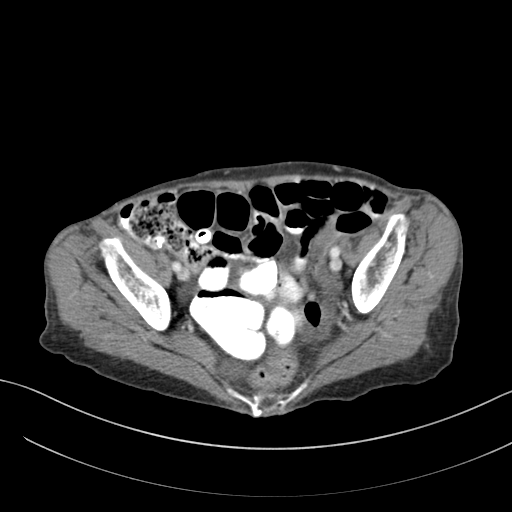
[im 38/89  soft-tissue]
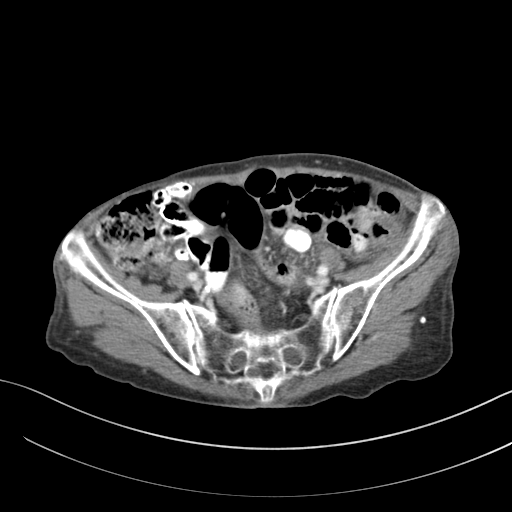
[im 45/89  soft-tissue]
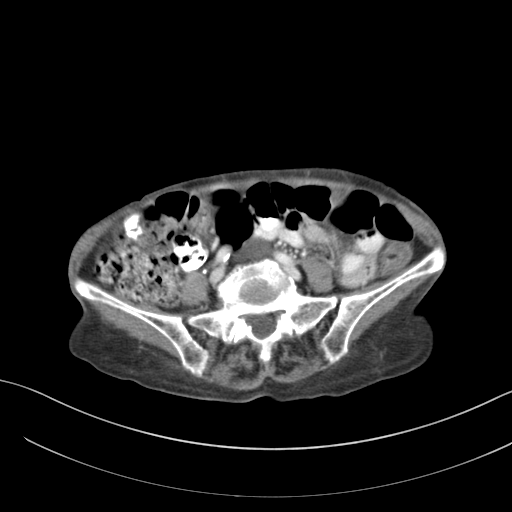
[im 51/89  soft-tissue]
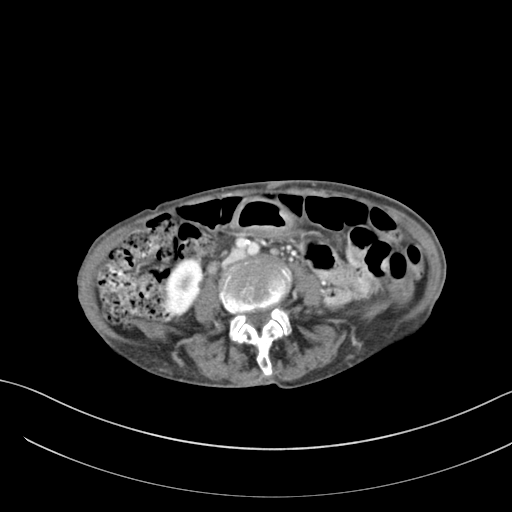
[im 57/89  soft-tissue]
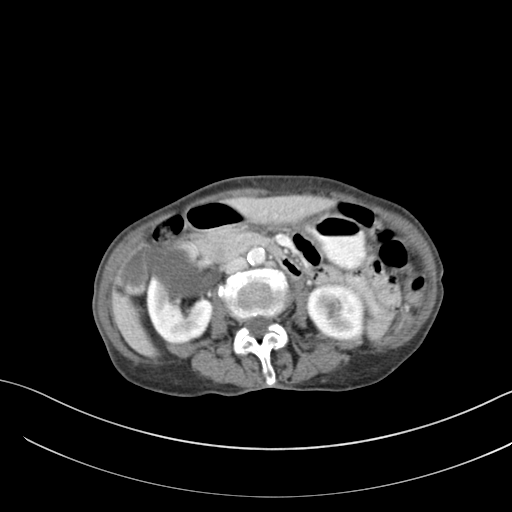
[im 57/89  bone]
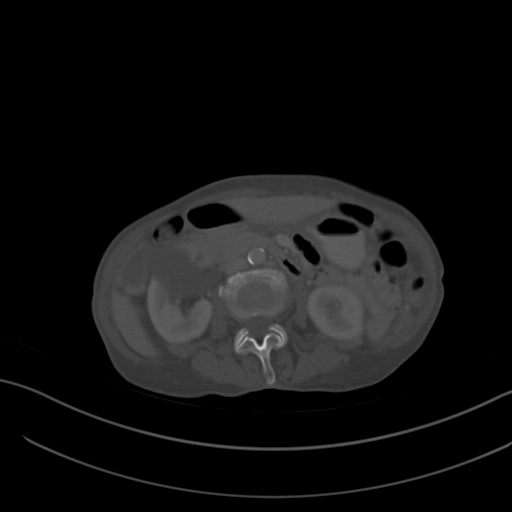
[im 63/89  soft-tissue]
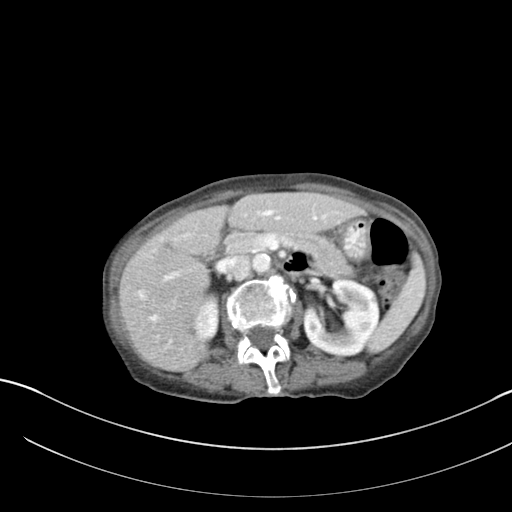
[im 70/89  soft-tissue]
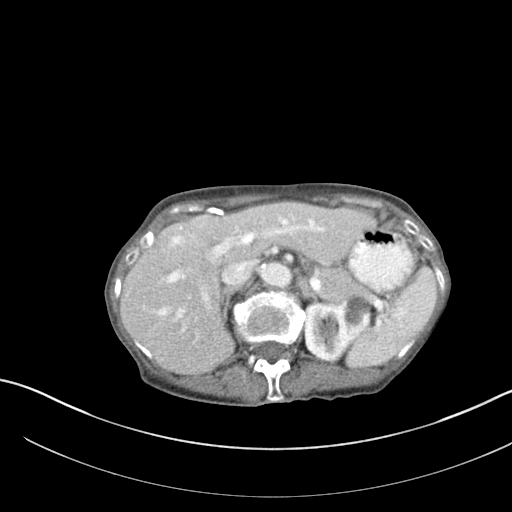
[im 76/89  soft-tissue]
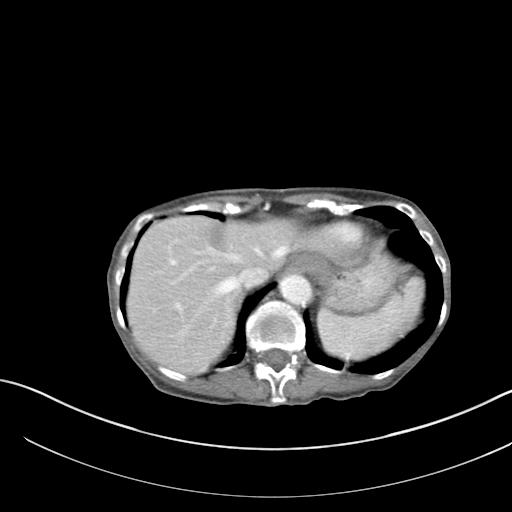
[im 82/89  soft-tissue]
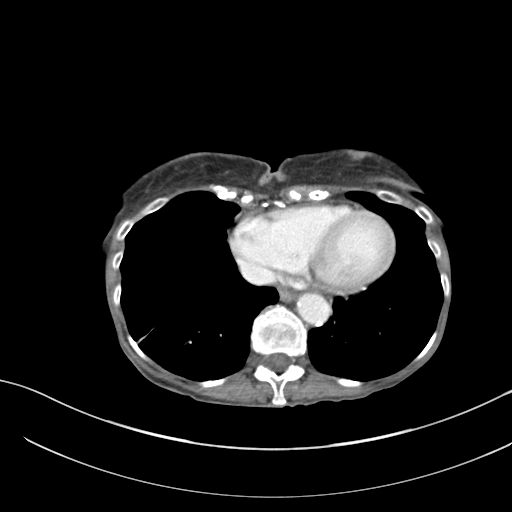

[Series 4: coronal st · coronal · 0.84mm/px · 3 of 93 slices shown]
[im 31/93  soft-tissue]
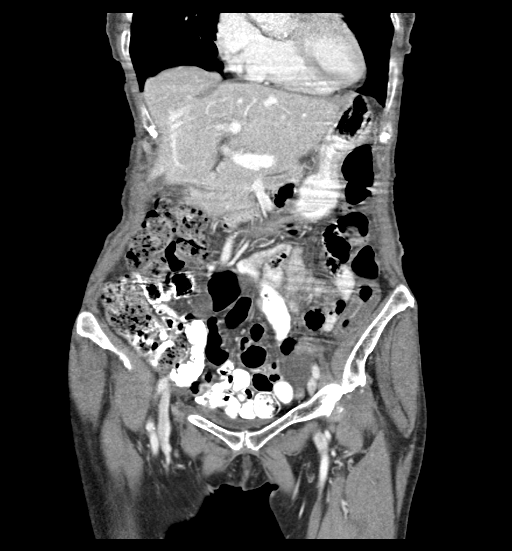
[im 41/93  soft-tissue]
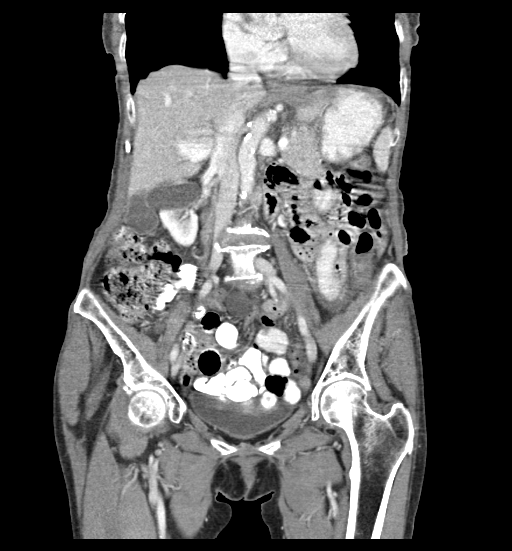
[im 52/93  soft-tissue]
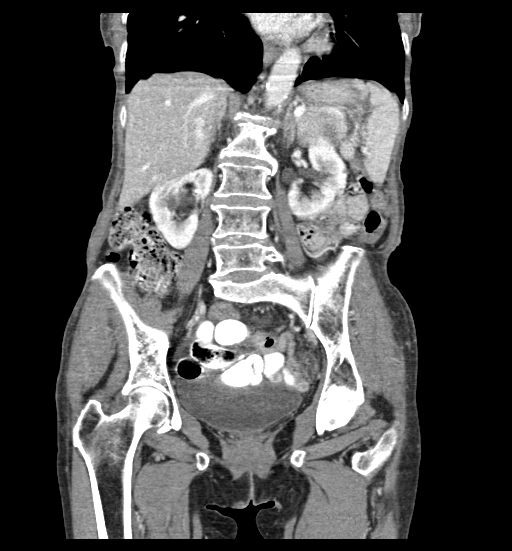

[16 of 46 positions shown; findings below may reference images not displayed]

FINDINGS: Lower chest: No acute pleural or parenchymal lung disease.

Hepatobiliary: Calcified gallstones are unchanged. No evidence of
acute cholecystitis. The liver is unremarkable.

Pancreas: Unremarkable. No pancreatic ductal dilatation or
surrounding inflammatory changes.

Spleen: Normal in size without focal abnormality.

Adrenals/Urinary Tract: Bilateral renal cortical cysts are
unchanged. No urinary tract calculi or obstructive uropathy.

The adrenals are grossly normal. The bladder is minimally distended
with no focal abnormalities.

Stomach/Bowel: No bowel obstruction or ileus. There is wall
thickening of the descending and sigmoid colon most compatible with
inflammatory or infectious colitis.

Vascular/Lymphatic: Aortic atherosclerosis. No enlarged abdominal or
pelvic lymph nodes.

Reproductive: Uterus is surgically absent. There is a stable simple
appearing left adnexal cyst measuring 3 cm. No right adnexal
lesions.

Other: No free fluid or free gas. Postsurgical changes from right
inguinal hernia repair. No recurrent hernia.

Musculoskeletal: No acute or destructive bony lesions. Reconstructed
images demonstrate no additional findings.
IMPRESSION: 1. Wall thickening of the descending and sigmoid colon most
compatible with inflammatory or infectious colitis.
2. Cholelithiasis without evidence of acute cholecystitis.
3. Stable 3 cm left adnexal cyst.
4. Aortic Atherosclerosis ([id number]).

## 2020-12-14 DIAGNOSIS — M810 Age-related osteoporosis without current pathological fracture: Secondary | ICD-10-CM | POA: Insufficient documentation

## 2022-10-09 DIAGNOSIS — N952 Postmenopausal atrophic vaginitis: Secondary | ICD-10-CM | POA: Insufficient documentation

## 2022-10-17 LAB — HM DEXA SCAN

## 2022-11-29 ENCOUNTER — Emergency Department (HOSPITAL_COMMUNITY): Payer: Medicare Other

## 2022-11-29 ENCOUNTER — Emergency Department (HOSPITAL_COMMUNITY)
Admission: EM | Admit: 2022-11-29 | Discharge: 2022-11-29 | Disposition: A | Discharge status: Home / Self Care | Payer: Medicare Other | Attending: Emergency Medicine | Admitting: Emergency Medicine

## 2022-11-29 ENCOUNTER — Other Ambulatory Visit: Payer: Self-pay

## 2022-11-29 ENCOUNTER — Encounter (HOSPITAL_COMMUNITY): Payer: Self-pay

## 2022-11-29 DIAGNOSIS — Z23 Encounter for immunization: Secondary | ICD-10-CM | POA: Insufficient documentation

## 2022-11-29 DIAGNOSIS — E039 Hypothyroidism, unspecified: Secondary | ICD-10-CM | POA: Insufficient documentation

## 2022-11-29 DIAGNOSIS — U071 COVID-19: Secondary | ICD-10-CM | POA: Diagnosis not present

## 2022-11-29 DIAGNOSIS — I10 Essential (primary) hypertension: Secondary | ICD-10-CM | POA: Diagnosis not present

## 2022-11-29 DIAGNOSIS — W19XXXA Unspecified fall, initial encounter: Secondary | ICD-10-CM

## 2022-11-29 DIAGNOSIS — W01198A Fall on same level from slipping, tripping and stumbling with subsequent striking against other object, initial encounter: Secondary | ICD-10-CM | POA: Diagnosis not present

## 2022-11-29 DIAGNOSIS — S0990XA Unspecified injury of head, initial encounter: Secondary | ICD-10-CM | POA: Diagnosis present

## 2022-11-29 DIAGNOSIS — Z79899 Other long term (current) drug therapy: Secondary | ICD-10-CM | POA: Diagnosis not present

## 2022-11-29 DIAGNOSIS — R55 Syncope and collapse: Secondary | ICD-10-CM | POA: Insufficient documentation

## 2022-11-29 LAB — TSH: TSH: 2.578 u[IU]/mL (ref 0.350–4.500)

## 2022-11-29 LAB — COMPREHENSIVE METABOLIC PANEL
ALT: 17 U/L (ref 0–44)
AST: 28 U/L (ref 15–41)
Albumin: 3.7 g/dL (ref 3.5–5.0)
Alkaline Phosphatase: 59 U/L (ref 38–126)
Anion gap: 9 (ref 5–15)
BUN: 13 mg/dL (ref 8–23)
CO2: 28 mmol/L (ref 22–32)
Calcium: 8.9 mg/dL (ref 8.9–10.3)
Chloride: 102 mmol/L (ref 98–111)
Creatinine, Ser: 0.61 mg/dL (ref 0.44–1.00)
GFR, Estimated: >60 mL/min (ref >60)
Glucose, Bld: 121 mg/dL — ABNORMAL HIGH (ref 70–99)
Potassium: 3.1 mmol/L — ABNORMAL LOW (ref 3.5–5.1)
Sodium: 139 mmol/L (ref 135–145)
Total Bilirubin: 0.4 mg/dL (ref 0.3–1.2)
Total Protein: 6.8 g/dL (ref 6.5–8.1)

## 2022-11-29 LAB — MAGNESIUM: Magnesium: 2 mg/dL (ref 1.7–2.4)

## 2022-11-29 LAB — CBC
HCT: 38 % (ref 36.0–46.0)
Hemoglobin: 12.3 g/dL (ref 12.0–15.0)
MCH: 29.6 pg (ref 26.0–34.0)
MCHC: 32.4 g/dL (ref 30.0–36.0)
MCV: 91.6 fL (ref 80.0–100.0)
Platelets: 172 10*3/uL (ref 150–400)
RBC: 4.15 MIL/uL (ref 3.87–5.11)
RDW: 13.7 % (ref 11.5–15.5)
WBC: 7.4 10*3/uL (ref 4.0–10.5)
nRBC: 0 % (ref 0.0–0.2)

## 2022-11-29 LAB — TROPONIN I (HIGH SENSITIVITY)
Troponin I (High Sensitivity): 6 ng/L (ref <18)
Troponin I (High Sensitivity): 5 ng/L (ref <18)

## 2022-11-29 LAB — RESP PANEL BY RT-PCR (RSV, FLU A&B, COVID)  RVPGX2
Influenza A by PCR: NEGATIVE
Influenza B by PCR: NEGATIVE
Resp Syncytial Virus by PCR: NEGATIVE
SARS Coronavirus 2 by RT PCR: POSITIVE — AB

## 2022-11-29 LAB — CBG MONITORING, ED
Glucose-Capillary: 138 mg/dL — ABNORMAL HIGH (ref 70–99)
Glucose-Capillary: 132 mg/dL — ABNORMAL HIGH (ref 70–99)

## 2022-11-29 MED ORDER — LACTATED RINGERS IV BOLUS
1000.0000 mL | Freq: Once | INTRAVENOUS | Status: AC
  Administered 2022-11-29: 1000 mL via INTRAVENOUS

## 2022-11-29 MED ORDER — BENZOCAINE 20 % MT AERO
INHALATION_SPRAY | Freq: Once | OROMUCOSAL | Status: AC
  Administered 2022-11-29: 1 via OROMUCOSAL
  Filled 2022-11-29: qty 57

## 2022-11-29 MED ORDER — ONDANSETRON 4 MG PO TBDP: 4.0000 mg | ORAL_TABLET | Freq: Three times a day (TID) | ORAL | 0 refills | Status: DC | PRN

## 2022-11-29 MED ORDER — POTASSIUM CHLORIDE CRYS ER 20 MEQ PO TBCR
40.0000 meq | EXTENDED_RELEASE_TABLET | Freq: Once | ORAL | Status: AC
  Administered 2022-11-29: 40 meq via ORAL
  Filled 2022-11-29: qty 2

## 2022-11-29 MED ORDER — TETANUS-DIPHTH-ACELL PERTUSSIS 5-2.5-18.5 LF-MCG/0.5 IM SUSY
0.5000 mL | PREFILLED_SYRINGE | Freq: Once | INTRAMUSCULAR | Status: AC
  Administered 2022-11-29: 0.5 mL via INTRAMUSCULAR
  Filled 2022-11-29: qty 0.5

## 2022-11-29 MED ORDER — IOHEXOL 350 MG/ML SOLN
75.0000 mL | Freq: Once | INTRAVENOUS | Status: AC | PRN
  Administered 2022-11-29: 75 mL via INTRAVENOUS

## 2022-11-29 MED ORDER — HYDROGEN PEROXIDE 3 % EX SOLN
CUTANEOUS | Status: AC
  Filled 2022-11-29: qty 473

## 2022-11-29 MED ORDER — LIDOCAINE-EPINEPHRINE (PF) 2 %-1:200000 IJ SOLN
10.0000 mL | Freq: Once | INTRAMUSCULAR | Status: AC
  Administered 2022-11-29: 10 mL via INTRADERMAL
  Filled 2022-11-29: qty 20

## 2022-11-29 NOTE — Discharge Instructions (Addendum)
Voc foi atendido por causa de [place] e COVID-19 no pronto-socorro.  Em casa, pare de tomar Paxlovid, pois  provvel que cause efeitos colaterais com outros medicamentos.  Faa acompanhamento com seu mdico principal em 2 a 3 dias aps sua consulta.  Retorne imediatamente ao pronto-socorro se sentir [employer] seguintes sintomas: Dificuldade em respirar, desmaios ou qualquer outro sintoma preocupante.  Obrigado por visitar nosso Departamento de [place]. Foi um prazer cuidar de voc hoje.  ------  You were seen for your fall and COVID-19 in the emergency department.   At home, please stop taking the Paxlovid as it is likely causing side effects with your other medications.    Follow-up with your primary doctor in 2-3 days regarding your visit.    Return immediately to the emergency department if you experience any of the following: Difficulty breathing, fainting, or any other concerning symptoms.    Thank you for visiting our Emergency Department. It was a pleasure taking care of you today.

## 2022-11-29 NOTE — ED Provider Notes (Signed)
[facility]-EMERGENCY DEPT Provider Note   CSN: [id number] Arrival date & time: 11/29/22  0734     History  Chief Complaint  Patient presents with   Leslie Fuller    Leslie Fuller is a 81 y.o. female.  History obtained via Leslie Fuller interpreter.  81 year old female with a history of hypothyroidism, hypertension, and hyperlipidemia who presented to the emergency department after fall.  Patient reports that this morning at 6 AM she was carrying a glass of water and started to feel palpitations and then fell down striking the back of her head.  No loss of consciousness.  Is not on blood thinners.  Denies any chest pain.  Has had some shortness of breath since Christmas day when her husband was diagnosed with COVID and she started developing similar symptoms.  Has had significantly decreased p.o. intake since then.  Has been taking Paxlovid at home.  Per EMS, her oxygen levels were low and she was placed on nasal cannula.  No known history of arrhythmias or syncope.  Per daughter, had an additional syncopal episode in the waiting room while her daughter was holding her.       Home Medications Prior to Admission medications   Medication Sig Start Date End Date Taking? Authorizing Provider  ondansetron (ZOFRAN-ODT) 4 MG disintegrating tablet Take 1 tablet (4 mg total) by mouth every 8 (eight) hours as needed for nausea or vomiting. 11/29/22  Yes Rondel Baton, MD  acetaminophen (TYLENOL) 500 MG tablet You can take 1000 mg every 8 hours as needed for pain.  You can also alternate plain Tylenol/acetominophin with ibuprofen. Start the ibuprofen about 2 hours after you take the Tylenol for additional pain relief.  You can buy this over the counter at any drug store. 07/31/19   Sherrie George, PA-C  calcium-vitamin D (OSCAL WITH D) 500-200 MG-UNIT tablet Take 1 tablet by mouth daily with breakfast.    [provider]  ciprofloxacin (CIPRO) 500 MG tablet Take 1 tablet  (500 mg total) by mouth 2 (two) times daily. One po bid x 7 days 04/21/20   Mancel Bale, MD  diltiazem (CARDIZEM CD) 240 MG 24 hr capsule Take by mouth. 11/22/16 09/03/19  [provider]  ibuprofen (ADVIL) 200 MG tablet You can take 2 tablets every 6 hours as needed for pain relief.  You can alternate this with plain Tylenol/acetaminophen.  You can buy this over the counter at any drug store. 07/31/19   Sherrie George, PA-C  levothyroxine (SYNTHROID) 50 MCG tablet Take 50 mcg by mouth See admin instructions. Take one tablet daily except for Sundays. 06/16/17 09/03/19  [provider]  metroNIDAZOLE (FLAGYL) 500 MG tablet Take 1 tablet (500 mg total) by mouth 2 (two) times daily. One po bid x 7 days 04/21/20   Mancel Bale, MD  pravastatin (PRAVACHOL) 10 MG tablet Take 10 mg by mouth at bedtime.  06/13/17 09/03/19  [provider]  sertraline (ZOLOFT) 100 MG tablet Take 150 mg by mouth daily.  06/16/17   [provider]  traMADol (ULTRAM) 50 MG tablet Take 1 tablet (50 mg total) by mouth every 6 (six) hours as needed for moderate pain or severe pain (pain not relieved by PO tylenol and ibuprofen). 07/31/19   Sherrie George, PA-C  ZINC SULFATE PO Take 2 capsules by mouth daily.    [provider]      Allergies    Patient has no known allergies.    Review of Systems  Review of Systems  Physical Exam Updated Vital Signs BP 125/73   Pulse 68   Temp 97.8 F (36.6 C) (Oral)   Resp 18   Ht 5' (1.524 m)   Wt 36.8 kg   SpO2 97%   BMI 15.84 kg/m  Physical Exam Vitals and nursing note reviewed.  Constitutional:      General: She is not in acute distress.    Appearance: She is well-developed.  HENT:     Head: Normocephalic.     Comments: Dried blood on occiput of patient's head    Right Ear: External ear normal.     Left Ear: External ear normal.     Nose: Nose normal.  Eyes:     Extraocular Movements: Extraocular movements intact.      Conjunctiva/sclera: Conjunctivae normal.     Pupils: Pupils are equal, round, and reactive to light.  Neck:     Comments: No midline C-spine tenderness to palpation Cardiovascular:     Rate and Rhythm: Normal rate and regular rhythm.     Heart sounds: No murmur heard. Pulmonary:     Effort: Pulmonary effort is normal. No respiratory distress.     Breath sounds: Normal breath sounds.  Abdominal:     General: Abdomen is flat. There is no distension.     Palpations: Abdomen is soft. There is no mass.     Tenderness: There is no abdominal tenderness. There is no guarding.  Musculoskeletal:     Cervical back: Normal range of motion and neck supple.     Right lower leg: No edema.     Left lower leg: No edema.  Skin:    General: Skin is warm and dry.  Neurological:     Mental Status: She is alert and oriented to person, place, and time. Mental status is at baseline.  Psychiatric:        Mood and Affect: Mood normal.     ED Results / Procedures / Treatments   Labs (all labs ordered are listed, but only abnormal results are displayed) Labs Reviewed  RESP PANEL BY RT-PCR (RSV, FLU A&B, COVID)  RVPGX2 - Abnormal; Notable for the following components:      Result Value   SARS Coronavirus 2 by RT PCR POSITIVE (*)    All other components within normal limits  COMPREHENSIVE METABOLIC PANEL - Abnormal; Notable for the following components:   Potassium 3.1 (*)    Glucose, Bld 121 (*)    All other components within normal limits  CBG MONITORING, ED - Abnormal; Notable for the following components:   Glucose-Capillary 138 (*)    All other components within normal limits  CBG MONITORING, ED - Abnormal; Notable for the following components:   Glucose-Capillary 132 (*)    All other components within normal limits  CBC  MAGNESIUM  TSH  TROPONIN I (HIGH SENSITIVITY)  TROPONIN I (HIGH SENSITIVITY)  TROPONIN I (HIGH SENSITIVITY)    EKG EKG Interpretation  Date/Time:  Wednesday November 29 2022 16:34:55 EST Ventricular Rate:  68 PR Interval:  183 QRS Duration: 138 QT Interval:  411 QTC Calculation: 438 R Axis:   37 Text Interpretation: Sinus rhythm Right bundle branch block Confirmed by Vonita Moss [phone]) on 11/29/2022 5:12:34 PM  Radiology CT Cervical Spine Wo Contrast  Result Date: 11/29/2022 CLINICAL DATA:  Trauma, fall EXAM: CT CERVICAL SPINE WITHOUT CONTRAST TECHNIQUE: Multidetector CT imaging of the cervical spine was performed without intravenous contrast. Multiplanar CT image reconstructions were also  [place]  [phone] Lincoln AvenueThe Endoscopy Center Of TexarkanaJerelyn ScottJaynie CrumbleWoodsideOvid Curd  Claudine Mouton [phone] HospitalJerelyn ScottJaynie CrumbleRomeovilleOvid Curd  Claudine Mouton [phone] Riverside DriveMerit Health WesleyJerelyn ScottJaynie CrumbleUpper Red HookOvid Curd  Claudine Mouton [id number] HealthcareJerelyn ScottJaynie CrumbleByramOvid Curd  Claudine Mouton [phone] West Harrison AvenueE Ronald Salvitti Md Dba South[facility] Surgery CenterJerelyn ScottJaynie CrumbleCameronOvid Curd   are seen in noncontrast CT brain. No fracture is seen in calvarium. There is subcutaneous contusion/hematoma in posterior parietal scalp. Small pockets of air in the same region suggest open wound in the skin. Mild chronic sinusitis. Electronically Signed   By: Ernie Avena M.D.   On: 11/29/2022 13:25    Procedures Procedures   Medications Ordered in ED Medications  potassium chloride SA (KLOR-CON M) CR tablet 40 mEq (40 mEq Oral Given 11/29/22 1245)  lactated ringers bolus 1,000 mL (0 mLs Intravenous Stopped 11/29/22 1752)  Tdap (BOOSTRIX) injection 0.5 mL (0.5 mLs Intramuscular Given 11/29/22 1243)  hydrogen peroxide 3 % external solution (  Given 11/29/22 1611)  iohexol (OMNIPAQUE) 350 MG/ML injection 75 mL (75 mLs Intravenous Contrast Given 11/29/22 1320)  lidocaine-EPINEPHrine (XYLOCAINE W/EPI) 2 %-1:200000 (PF) injection 10 mL (10 mLs Intradermal Given 11/29/22 1611)  Benzocaine (HURRCAINE) 20 % mouth spray (1 Application Mouth/Throat Given 11/29/22 1610)    ED Course/ Medical Decision Making/ A&P Clinical Course as of  12/01/22 0925  Wed Nov 29, 2022  1419 CT scan did not show evidence of PE or pneumonia.  CT head without acute intracranial abnormality and CT C-spine also without abnormality. [RP]    Clinical Course User Index [RP] Rondel Baton, MD                           Medical Decision Making Amount and/or Complexity of Data Reviewed Labs: ordered. Radiology: ordered.  Risk Prescription drug management.   Marquita Lias is a 81 y.o. female with comorbidities that complicate the patient evaluation including hypothyroidism, hypertension, and hyperlipidemia who presented to the emergency department after generalized weakness, syncope with head strike, and presumed COVID diagnosis  Initial Ddx:  Dehydration, COVID-19, arrhythmia, Paxlovid side effect, MI, PE, ICH, C-spine injury  MDM:  Feel that patient likely had a syncopal event in the setting of dehydration or perhaps from her Paxlovid that she started.  Feel this was precipitated by her COVID.  Will obtain EKG and electrolytes to evaluate for possible arrhythmia.  Also obtain troponin to evaluate for MI but feel this is highly unlikely.  Will also obtain CT of the chest to evaluate for pulmonary embolism with her syncopal event and the fact that COVID can sometimes increase thromboembolic events as well as rib fractures as result of her fall.  Given her age will obtain CT of the head and C-spine to evaluate for ICH and/or C-spine injury.  Will also obtain COVID and flu since she has not been tested formally for COVID yet.  Plan:  Labs EKG COVID/flu Chest x-ray CT head CT C-spine CTA chest Zofran Fluid bolus Tdap  ED Summary/Re-evaluation:  Patient reassessed and was stable.  CT did not reveal any acute abnormalities.  She was feeling much better after her fluid bolus and was able to ambulate without difficulty.  No evidence of desaturations in the emergency department.  Did end up testing COVID-positive and after discussing with  pharmacy they felt that she should have her Paxlovid discontinued due to possible interactions with diltiazem which could be causing orthostasis and dizziness.  Cleaned the cut on the back of her head which appears to be a small puncture wound not amenable to staples or sutures.  Will have the patient call her primary doctor to follow-up in several days and discussed return precautions with her and her daughter.  This patient presents to the ED for concern of complaints listed in  HPI, this involves an extensive number of treatment options, and is a complaint that carries with it a high risk of complications and morbidity. Disposition including potential need for admission considered.   Dispo: DC Home. Return precautions discussed including, but not limited to, those listed in the AVS. Allowed pt time to ask questions which were answered fully prior to dc.  Additional history obtained from daughter Records reviewed Outpatient Clinic Notes The following labs were independently interpreted: Chemistry and show  hypokalemia I independently reviewed the following imaging with scope of interpretation limited to determining acute life threatening conditions related to emergency care: CT Head, CT Cervical Spine, and CTA chest  and agree with the radiologist interpretation with the following exceptions: None I personally reviewed and interpreted cardiac monitoring: normal sinus rhythm  I personally reviewed and interpreted the pt's EKG: see above for interpretation  I have reviewed the patients home medications and made adjustments as needed Social Determinants of health:  Leslie Fuller speaking  Final Clinical Impression(s) / ED Diagnoses Final diagnoses:  Fall, initial encounter  Injury of head, initial encounter  COVID-19  Syncope, unspecified syncope type    Rx / DC Orders ED Discharge Orders          Ordered    ondansetron (ZOFRAN-ODT) 4 MG disintegrating tablet  Every 8 hours PRN         11/29/22 1747              Rondel Baton, MD 12/01/22 [phone]

## 2022-11-29 NOTE — ED Triage Notes (Signed)
Pt BIB EMS covid+ 12/24 per family pt has been more confused than normal. Pt had mechanical fall pta, laceration to head bleeding controlled in triage. Not on blood thinners.

## 2023-10-29 DIAGNOSIS — F1329 Sedative, hypnotic or anxiolytic dependence with unspecified sedative, hypnotic or anxiolytic-induced disorder: Secondary | ICD-10-CM | POA: Insufficient documentation

## 2023-11-29 ENCOUNTER — Other Ambulatory Visit (HOSPITAL_BASED_OUTPATIENT_CLINIC_OR_DEPARTMENT_OTHER): Payer: Self-pay

## 2024-01-10 ENCOUNTER — Telehealth: Payer: Self-pay

## 2024-01-10 NOTE — Telephone Encounter (Signed)
 Copied from CRM [id number]. Topic: General - Other >> Jan 10, 2024  1:35 PM Valeri Gate H wrote: Reason for CRM: Patient's daughter returning call from [place]. Call back number is [phone].

## 2024-01-15 ENCOUNTER — Ambulatory Visit (INDEPENDENT_AMBULATORY_CARE_PROVIDER_SITE_OTHER): Payer: Medicare Other | Admitting: Family Medicine

## 2024-01-15 ENCOUNTER — Encounter: Payer: Self-pay | Admitting: Family Medicine

## 2024-01-15 VITALS — BP 150/70 | HR 64 | Ht 60.0 in | Wt 87.6 lb

## 2024-01-15 DIAGNOSIS — E039 Hypothyroidism, unspecified: Secondary | ICD-10-CM

## 2024-01-15 DIAGNOSIS — I10 Essential (primary) hypertension: Secondary | ICD-10-CM | POA: Diagnosis not present

## 2024-01-15 DIAGNOSIS — G4709 Other insomnia: Secondary | ICD-10-CM | POA: Diagnosis not present

## 2024-01-15 DIAGNOSIS — F321 Major depressive disorder, single episode, moderate: Secondary | ICD-10-CM | POA: Diagnosis not present

## 2024-01-15 MED ORDER — SERTRALINE HCL 100 MG PO TABS: ORAL_TABLET | ORAL | 1 refills | Status: DC

## 2024-01-15 MED ORDER — DILTIAZEM HCL ER COATED BEADS 360 MG PO CP24
360.0000 mg | ORAL_CAPSULE | Freq: Every day | ORAL | 1 refills | Status: DC
Start: 2024-01-15 — End: 2024-02-12

## 2024-01-15 NOTE — Progress Notes (Unsigned)
Patient Office Visit  Assessment & Plan:  Essential hypertension -     dilTIAZem HCl ER Coated Beads; Take 1 capsule (360 mg total) by mouth daily.  Dispense: 90 capsule; Refill: 1  Hypothyroidism, unspecified type  Other insomnia  Current moderate episode of major depressive disorder, unspecified whether recurrent (HCC) -     Sertraline HCl; 1.5 tablets per day.  Dispense: 135 tablet; Refill: 1   Total time spent on today's visit was greater than 45 minutes, including both face-to-face time and nonface-to-face time personally spent on review of chart from previous location, cardiology notes from Dr. Karren Burly, discussing further work-up, treatment options,  reviewing outside records of pertinent relevance. Patient will continue the current dosage of the Cardizem CD until she has her eye surgery this Friday.  After the eye surgery she will reduce the Cardizem CD to 2 360 mg once a day.  Patient also take half a tablet of the Ativan 0.5 mg prior to the eye surgery on Friday.  Increase sertraline to 150 mg once a day.  Return in 4 weeks or sooner if necessary.  Patient will continue to monitor blood pressures at home and reduce sodium intake. Return in about 4 weeks (around 02/12/2024), or if symptoms worsen or fail to improve.   Subjective:    Patient ID: Leslie Fuller, female    DOB: 1941/02/10  Age: 83 y.o. MRN: [id number]  Chief Complaint  Patient presents with   Establish Care    Pt has been having issues with elevated BP and is having cataract surgert on Friday and family is worried about BP.    HPI Establish care and discuss elevated BP readings.  Of note we did not need a [place] interpreter at the [facility] location.  Patient did need an interpreter when she was hospitalized at [facility] a few years ago. HTN-using antihypertensive medication without difficulty.  Denies associated signs and symptoms including chest pain, shortness of breath, cough headache, peripheral  swelling cramps spasms and palpitations.  Voices understanding of the potential for interference with blood pressure control with substances including high sodium intake, decongestions, herbal supplements weight loss supplements nutritional supplements.  Blood pressures have been elevated.  Blood pressure was 190/90 prior to her last cataract surgery.  Patient has been checking it at home and blood pressure readings have been elevated greater than 150/90.  Patient is feeling fine.  Patient did try losartan 25 mg in addition to the Cardizem but cannot tolerate the losartan.  Losartan made her dizzy so she stopped taking it.  Patient only tried losartan one time.  Patient has been doubling the diltiazem to 240 twice a day the past week and feels much better and blood pressures have come down.  Patient not having worsening constipation.  Heart rate has been in normal range.  Pt would rather do this until her eye surgery this Friday.  Insomnia - chronic-patient has been chronic for a long time.  Patient has been taking Ativan 1 mg for several years.  Patient used to take 0.5 mg but then another physician increase to the 1 mg and she has been sleeping fine on current medication.  Patient cannot sleep without med.  Pt has tried other agents such as Trazodone and Remeron but were ineffective or she had negative side effects.  Palpitations and right bundle branch block-patient did see Dr. Karren Burly cardiology 2 years ago in [facility].  No medication changes were made.  Patient did have  an echo which was normal.  Patient continued on diltiazem CD2 240mg  once per day. No med changes were made.  Depression-patient has had worsening depression and anxiety past couple months.  Patient not sure if it is due to the wintertime or upcoming cataract surgery this Friday.  Patient has not increased her Zoloft in the long time.  Patient has been taking the 100 mg once a day we had recommended her increasing it previously but she never  did.  Patient feels isolated where she is does not have much interaction with friends.  Daughter does live upstairs which is positive.  The ASCVD Risk score (Arnett DK, et al., 2019) failed to calculate for the following reasons:   The 2019 ASCVD risk score is only valid for ages 69 to 35  Past Medical History:  Diagnosis Date   Anxiety    Hypertension    Thyroid disease    Past Surgical History:  Procedure Laterality Date   ABDOMINAL HYSTERECTOMY     INGUINAL HERNIA REPAIR Right 07/29/2019   Procedure: RIGHT FEMORAL HERNIA REPAIR WITH MESH;  Surgeon: Griselda Miner, MD;  Location: [facility];  Service: General;  Laterality: Right;   THYROIDECTOMY     Social History   Tobacco Use   Smoking status: Never   Smokeless tobacco: Never  Substance Use Topics   Alcohol use: Never   Drug use: Never   Family History  Problem Relation Age of Onset   Heart attack Father    No Known Allergies  ROS    Objective:    BP (!) 150/70   Pulse 64   Ht 5' (1.524 m)   Wt 87 lb 9.6 oz (39.7 kg)   SpO2 95%   BMI 17.11 kg/m  BP Readings from Last 3 Encounters:  01/15/24 (!) 150/70  11/29/22 125/73  04/21/20 (!) 169/88   Wt Readings from Last 3 Encounters:  01/15/24 87 lb 9.6 oz (39.7 kg)  11/29/22 81 lb 2.1 oz (36.8 kg)  07/29/19 81 lb 2.1 oz (36.8 kg)    Physical Exam Vitals and nursing note reviewed.  Constitutional:      Appearance: Normal appearance.  HENT:     Head: Normocephalic.     Right Ear: Tympanic membrane, ear canal and external ear normal.     Left Ear: Tympanic membrane, ear canal and external ear normal.  Eyes:     Extraocular Movements: Extraocular movements intact.     Conjunctiva/sclera: Conjunctivae normal.     Pupils: Pupils are equal, round, and reactive to light.  Cardiovascular:     Rate and Rhythm: Normal rate and regular rhythm.     Heart sounds: Normal heart sounds.  Pulmonary:     Effort: Pulmonary effort is normal.     Breath sounds: Normal  breath sounds.  Musculoskeletal:     Right lower leg: No edema.     Left lower leg: No edema.  Neurological:     General: No focal deficit present.     Mental Status: She is alert and oriented to person, place, and time.  Psychiatric:        Attention and Perception: Attention and perception normal.        Mood and Affect: Mood is depressed. Affect is tearful.        Speech: Speech normal.        Behavior: Behavior normal.        Thought Content: Thought content normal.  Judgment: Judgment normal.      No results found for any visits on 01/15/24.

## 2024-01-16 ENCOUNTER — Encounter: Payer: Self-pay | Admitting: Family Medicine

## 2024-01-18 DIAGNOSIS — H2512 Age-related nuclear cataract, left eye: Secondary | ICD-10-CM | POA: Diagnosis not present

## 2024-01-18 DIAGNOSIS — I10 Essential (primary) hypertension: Secondary | ICD-10-CM | POA: Diagnosis not present

## 2024-01-18 DIAGNOSIS — E039 Hypothyroidism, unspecified: Secondary | ICD-10-CM | POA: Diagnosis not present

## 2024-02-12 ENCOUNTER — Ambulatory Visit (INDEPENDENT_AMBULATORY_CARE_PROVIDER_SITE_OTHER): Payer: Medicare Other | Admitting: Family Medicine

## 2024-02-12 ENCOUNTER — Encounter: Payer: Self-pay | Admitting: Family Medicine

## 2024-02-12 ENCOUNTER — Ambulatory Visit: Payer: Medicare Other | Admitting: Family Medicine

## 2024-02-12 VITALS — BP 110/68 | HR 70 | Temp 98.4°F | Ht 60.0 in | Wt 85.5 lb

## 2024-02-12 DIAGNOSIS — Z79899 Other long term (current) drug therapy: Secondary | ICD-10-CM

## 2024-02-12 DIAGNOSIS — G4709 Other insomnia: Secondary | ICD-10-CM | POA: Diagnosis not present

## 2024-02-12 DIAGNOSIS — F321 Major depressive disorder, single episode, moderate: Secondary | ICD-10-CM

## 2024-02-12 DIAGNOSIS — E785 Hyperlipidemia, unspecified: Secondary | ICD-10-CM

## 2024-02-12 DIAGNOSIS — E039 Hypothyroidism, unspecified: Secondary | ICD-10-CM | POA: Diagnosis not present

## 2024-02-12 DIAGNOSIS — R002 Palpitations: Secondary | ICD-10-CM | POA: Diagnosis not present

## 2024-02-12 DIAGNOSIS — E440 Moderate protein-calorie malnutrition: Secondary | ICD-10-CM | POA: Diagnosis not present

## 2024-02-12 DIAGNOSIS — K5909 Other constipation: Secondary | ICD-10-CM | POA: Diagnosis not present

## 2024-02-12 DIAGNOSIS — I10 Essential (primary) hypertension: Secondary | ICD-10-CM | POA: Diagnosis not present

## 2024-02-12 LAB — URINALYSIS, ROUTINE W REFLEX MICROSCOPIC
Bacteria, UA: NONE SEEN /HPF
Bilirubin Urine: NEGATIVE
Glucose, UA: NEGATIVE
Hyaline Cast: NONE SEEN /LPF
Ketones, ur: NEGATIVE
Leukocytes,Ua: NEGATIVE
Nitrite: NEGATIVE
Protein, ur: NEGATIVE
Specific Gravity, Urine: 1.02 (ref 1.001–1.035)
WBC, UA: NONE SEEN /HPF (ref 0–5)
pH: 6.5 (ref 5.0–8.0)

## 2024-02-12 LAB — MICROSCOPIC MESSAGE

## 2024-02-12 NOTE — Progress Notes (Signed)
 Patient Office Visit  Assessment & Plan:  Essential hypertension -     CBC with Differential/Platelet -     COMPLETE METABOLIC PANEL WITH GFR -     Urinalysis, Routine w reflex microscopic  Hyperlipidemia, unspecified hyperlipidemia type -     Lipid panel  Other insomnia  Hypothyroidism, unspecified type -     TSH  Chronic constipation -     TSH  Taking a statin medication -     COMPLETE METABOLIC PANEL WITH GFR  Palpitations -     CBC with Differential/Platelet -     COMPLETE METABOLIC PANEL WITH GFR -     TSH -     Vitamin B12 -     Iron, TIBC and Ferritin Panel -     Magnesium  Current moderate episode of major depressive disorder, unspecified whether recurrent (HCC)   Urine looks normal.  Follow up on lab work and notify patient.  Continue healthy diet and consistent exercise.  Return in 3 months or sooner if necessary.  Patient has enough refills of her medications.  Patient will continue the Cardizem 240 mg once a day. Return in about 3 months (around 05/14/2024), or if symptoms worsen or fail to improve.   Subjective:    Patient ID: Leslie Fuller, female    DOB: 04/25/1941  Age: 83 y.o. MRN: [id number]  Chief Complaint  Patient presents with   Medical Management of Chronic Issues    HPI HTN-using antihypertensive medication without difficulty.  Denies associated signs and symptoms including chest pain, shortness of breath, cough headache, peripheral swelling cramps spasms and palpitations.  Voices understanding of the potential for interference with blood pressure control with substances including high sodium intake, decongestions, herbal supplements weight loss supplements nutritional supplements.  Blood pressures at home are less than 140/90.  Patient did go back to the Cardizem 240 mg once a day.  I had written for 360 mg last office visit to take after the cataract surgery but she actually did not feel good on the 360 mg due to worsening constipation so she  went back to Cardizem 240 mg once a day.  Patient does take the medicine at night time b/c she takes her thyroid med in the AM Hyperlipidemia-denies unusual muscle aches or muscle cramps or difficulty tolerating statin therapy.  Aware of need for diet control, exercise and healthy eating.  Patient is aware not to consume grapefruit juice or pomegranate juice. Patient cooks at home.  Insomnia - chronic-patient has been chronic for a long time.  Patient has been taking Ativan 1 mg for several years.  Patient used to take 0.5 mg but then another physician increase to the 1 mg and she has been sleeping fine on current medication.  Patient cannot sleep without med.  Pt has tried other agents such as Trazodone and Remeron but were ineffective or she had negative side effects.  Palpitations and right bundle branch block-patient did see Dr. Karren Burly cardiology 2 years ago in [facility].  No medication changes were made.  Patient did have an echo which was normal.  Patient notices the palpitations in the morning and lasts a few seconds maybe a minute.  Patient is not concerned about them. Hypothyroidism-patient has been taking thyroid medication for many years.  Patient has lost 2 pounds since last office visit but states she has good appetite.  TSH was stable last time.  Is currently taking levothyroxine 50 mcg once a day Depression- pt taking Zoloft  which is helping. Pt is feeling slightly better compared to last time.  In retrospect patient was nervous about the cataract surgery.  Patient got over that and is doing better. Constipation-patient has had this chronically.  Patient started using over-the-counter Metamucil which helps.  Patient had been using over-the-counter MiraLAX in the past.  Patient states she will have a bowel movement on average every other day. The ASCVD Risk score (Arnett DK, et al., 2019) failed to calculate for the following reasons:   The 2019 ASCVD risk score is only valid for ages 18 to  83  Past Medical History:  Diagnosis Date   Anxiety    Hypertension    Thyroid disease    Past Surgical History:  Procedure Laterality Date   ABDOMINAL HYSTERECTOMY     INGUINAL HERNIA REPAIR Right 07/29/2019   Procedure: RIGHT FEMORAL HERNIA REPAIR WITH MESH;  Surgeon: Griselda Miner, MD;  Location: [facility];  Service: General;  Laterality: Right;   THYROIDECTOMY     Social History   Tobacco Use   Smoking status: Never   Smokeless tobacco: Never  Substance Use Topics   Alcohol use: Never   Drug use: Never   Family History  Problem Relation Age of Onset   Heart attack Father    No Known Allergies  ROS    Objective:    BP 110/68   Pulse 70   Temp 98.4 F (36.9 C)   Ht 5' (1.524 m)   Wt 85 lb 8 oz (38.8 kg)   SpO2 99%   BMI 16.70 kg/m  BP Readings from Last 3 Encounters:  02/12/24 110/68  01/15/24 (!) 150/70  11/29/22 125/73   Wt Readings from Last 3 Encounters:  02/12/24 85 lb 8 oz (38.8 kg)  01/15/24 87 lb 9.6 oz (39.7 kg)  11/29/22 81 lb 2.1 oz (36.8 kg)    Physical Exam Vitals and nursing note reviewed.  Constitutional:      Appearance: Normal appearance.  HENT:     Head: Normocephalic.     Right Ear: Tympanic membrane, ear canal and external ear normal.     Left Ear: Tympanic membrane, ear canal and external ear normal.  Eyes:     Extraocular Movements: Extraocular movements intact.     Conjunctiva/sclera: Conjunctivae normal.     Pupils: Pupils are equal, round, and reactive to light.  Cardiovascular:     Rate and Rhythm: Normal rate and regular rhythm.     Heart sounds: Normal heart sounds.  Pulmonary:     Effort: Pulmonary effort is normal.     Breath sounds: Normal breath sounds.  Musculoskeletal:     Right lower leg: No edema.     Left lower leg: No edema.  Neurological:     General: No focal deficit present.     Mental Status: She is alert and oriented to person, place, and time.  Psychiatric:        Mood and Affect: Mood normal.         Behavior: Behavior normal.        Thought Content: Thought content normal.        Judgment: Judgment normal.      No results found for any visits on 02/12/24.

## 2024-02-13 LAB — CBC WITH DIFFERENTIAL/PLATELET
Absolute Lymphocytes: 1190 {cells}/uL (ref 850–3900)
Absolute Monocytes: 574 {cells}/uL (ref 200–950)
Basophils Absolute: 49 {cells}/uL (ref 0–200)
Basophils Relative: 0.7 %
Eosinophils Absolute: 147 {cells}/uL (ref 15–500)
Eosinophils Relative: 2.1 %
HCT: 41.3 % (ref 35.0–45.0)
Hemoglobin: 13.1 g/dL (ref 11.7–15.5)
MCH: 28.9 pg (ref 27.0–33.0)
MCHC: 31.7 g/dL — ABNORMAL LOW (ref 32.0–36.0)
MCV: 91.2 fL (ref 80.0–100.0)
MPV: 11 fL (ref 7.5–12.5)
Monocytes Relative: 8.2 %
Neutro Abs: 5040 {cells}/uL (ref 1500–7800)
Neutrophils Relative %: 72 %
Platelets: 294 10*3/uL (ref 140–400)
RBC: 4.53 10*6/uL (ref 3.80–5.10)
RDW: 12.7 % (ref 11.0–15.0)
Total Lymphocyte: 17 %
WBC: 7 10*3/uL (ref 3.8–10.8)

## 2024-02-13 LAB — LIPID PANEL
Cholesterol: 207 mg/dL — ABNORMAL HIGH (ref <200)
HDL: 66 mg/dL (ref >=50)
LDL Cholesterol (Calc): 119 mg/dL — ABNORMAL HIGH
Non-HDL Cholesterol (Calc): 141 mg/dL — ABNORMAL HIGH (ref <130)
Total CHOL/HDL Ratio: 3.1 (calc) (ref <5.0)
Triglycerides: 117 mg/dL (ref <150)

## 2024-02-13 LAB — COMPLETE METABOLIC PANEL WITH GFR
AG Ratio: 1.2 (calc) (ref 1.0–2.5)
ALT: 10 U/L (ref 6–29)
AST: 15 U/L (ref 10–35)
Albumin: 4.2 g/dL (ref 3.6–5.1)
Alkaline phosphatase (APISO): 105 U/L (ref 37–153)
BUN/Creatinine Ratio: 34 (calc) — ABNORMAL HIGH (ref 6–22)
BUN: 20 mg/dL (ref 7–25)
CO2: 34 mmol/L — ABNORMAL HIGH (ref 20–32)
Calcium: 9.6 mg/dL (ref 8.6–10.4)
Chloride: 101 mmol/L (ref 98–110)
Creat: 0.59 mg/dL — ABNORMAL LOW (ref 0.60–0.95)
Globulin: 3.5 g/dL (ref 1.9–3.7)
Glucose, Bld: 88 mg/dL (ref 65–99)
Potassium: 4.2 mmol/L (ref 3.5–5.3)
Sodium: 140 mmol/L (ref 135–146)
Total Bilirubin: 0.4 mg/dL (ref 0.2–1.2)
Total Protein: 7.7 g/dL (ref 6.1–8.1)
eGFR: 89 mL/min/{1.73_m2} (ref >=60)

## 2024-02-13 LAB — IRON,TIBC AND FERRITIN PANEL
%SAT: 19 % (ref 16–45)
Ferritin: 53 ng/mL (ref 16–288)
Iron: 58 ng/mL (ref 45–288)
TIBC: 312 ug/dL (ref 250–450)

## 2024-02-13 LAB — TSH: TSH: 2.13 m[IU]/L (ref 0.40–4.50)

## 2024-02-13 LAB — VITAMIN B12: Vitamin B-12: 488 pg/mL (ref 200–1100)

## 2024-02-13 LAB — MAGNESIUM: Magnesium: 2.5 mg/dL (ref 1.5–2.5)

## 2024-04-28 ENCOUNTER — Other Ambulatory Visit: Payer: Self-pay | Admitting: Family Medicine

## 2024-04-28 DIAGNOSIS — G4709 Other insomnia: Secondary | ICD-10-CM

## 2024-04-28 MED ORDER — LORAZEPAM 1 MG PO TABS: 1.0000 mg | ORAL_TABLET | Freq: Every day | ORAL | 1 refills | Status: DC

## 2024-04-28 NOTE — Progress Notes (Signed)
 Needs refill of Ativan  for insomnia- sent to [facility]

## 2024-05-05 ENCOUNTER — Ambulatory Visit: Admitting: Family Medicine

## 2024-05-16 ENCOUNTER — Other Ambulatory Visit: Payer: Self-pay | Admitting: Family Medicine

## 2024-05-16 DIAGNOSIS — F321 Major depressive disorder, single episode, moderate: Secondary | ICD-10-CM

## 2024-05-16 MED ORDER — SERTRALINE HCL 100 MG PO TABS: ORAL_TABLET | ORAL | 1 refills | Status: DC

## 2024-05-16 NOTE — Progress Notes (Signed)
 Re sent med.

## 2024-05-16 NOTE — Progress Notes (Signed)
 Needs refill of zoloft  - will send to pharmacy

## 2024-05-27 ENCOUNTER — Ambulatory Visit: Admitting: Family Medicine

## 2024-06-18 ENCOUNTER — Ambulatory Visit: Admitting: Family Medicine

## 2024-06-18 ENCOUNTER — Encounter: Payer: Self-pay | Admitting: Family Medicine

## 2024-06-18 VITALS — BP 110/78 | HR 66 | Temp 97.8°F | Ht 60.0 in | Wt 85.2 lb

## 2024-06-18 DIAGNOSIS — R002 Palpitations: Secondary | ICD-10-CM | POA: Diagnosis not present

## 2024-06-18 DIAGNOSIS — I4510 Unspecified right bundle-branch block: Secondary | ICD-10-CM | POA: Diagnosis not present

## 2024-06-18 DIAGNOSIS — R202 Paresthesia of skin: Secondary | ICD-10-CM

## 2024-06-18 DIAGNOSIS — G4709 Other insomnia: Secondary | ICD-10-CM

## 2024-06-18 DIAGNOSIS — E782 Mixed hyperlipidemia: Secondary | ICD-10-CM | POA: Diagnosis not present

## 2024-06-18 DIAGNOSIS — R200 Anesthesia of skin: Secondary | ICD-10-CM | POA: Diagnosis not present

## 2024-06-18 DIAGNOSIS — E039 Hypothyroidism, unspecified: Secondary | ICD-10-CM | POA: Diagnosis not present

## 2024-06-18 DIAGNOSIS — E440 Moderate protein-calorie malnutrition: Secondary | ICD-10-CM | POA: Diagnosis not present

## 2024-06-18 DIAGNOSIS — F321 Major depressive disorder, single episode, moderate: Secondary | ICD-10-CM

## 2024-06-18 DIAGNOSIS — I10 Essential (primary) hypertension: Secondary | ICD-10-CM | POA: Diagnosis not present

## 2024-06-18 LAB — COMPLETE METABOLIC PANEL WITHOUT GFR
AG Ratio: 1.2 (calc) (ref 1.0–2.5)
ALT: 11 U/L (ref 6–29)
AST: 17 U/L (ref 10–35)
Albumin: 4.3 g/dL (ref 3.6–5.1)
Alkaline phosphatase (APISO): 97 U/L (ref 37–153)
BUN/Creatinine Ratio: 29 (calc) — ABNORMAL HIGH (ref 6–22)
BUN: 16 mg/dL (ref 7–25)
CO2: 31 mmol/L (ref 20–32)
Calcium: 9.8 mg/dL (ref 8.6–10.4)
Chloride: 101 mmol/L (ref 98–110)
Creat: 0.56 mg/dL — ABNORMAL LOW (ref 0.60–0.95)
Globulin: 3.5 g/dL (ref 1.9–3.7)
Glucose, Bld: 81 mg/dL (ref 65–99)
Potassium: 4.5 mmol/L (ref 3.5–5.3)
Sodium: 140 mmol/L (ref 135–146)
Total Bilirubin: 0.4 mg/dL (ref 0.2–1.2)
Total Protein: 7.8 g/dL (ref 6.1–8.1)

## 2024-06-18 LAB — LIPID PANEL
Cholesterol: 211 mg/dL — ABNORMAL HIGH (ref <200)
HDL: 63 mg/dL (ref >=50)
LDL Cholesterol (Calc): 128 mg/dL — ABNORMAL HIGH
Non-HDL Cholesterol (Calc): 148 mg/dL — ABNORMAL HIGH (ref <130)
Total CHOL/HDL Ratio: 3.3 (calc) (ref <5.0)
Triglycerides: 92 mg/dL (ref <150)

## 2024-06-18 LAB — CBC WITH DIFFERENTIAL/PLATELET
Absolute Lymphocytes: 1244 {cells}/uL (ref 850–3900)
Absolute Monocytes: 416 {cells}/uL (ref 200–950)
Basophils Absolute: 28 {cells}/uL (ref 0–200)
Basophils Relative: 0.7 %
Eosinophils Absolute: 40 {cells}/uL (ref 15–500)
Eosinophils Relative: 1 %
HCT: 40.9 % (ref 35.0–45.0)
Hemoglobin: 13.1 g/dL (ref 11.7–15.5)
MCH: 29.2 pg (ref 27.0–33.0)
MCHC: 32 g/dL (ref 32.0–36.0)
MCV: 91.1 fL (ref 80.0–100.0)
MPV: 10.9 fL (ref 7.5–12.5)
Monocytes Relative: 10.4 %
Neutro Abs: 2272 {cells}/uL (ref 1500–7800)
Neutrophils Relative %: 56.8 %
Platelets: 275 Thousand/uL (ref 140–400)
RBC: 4.49 Million/uL (ref 3.80–5.10)
RDW: 13 % (ref 11.0–15.0)
Total Lymphocyte: 31.1 %
WBC: 4 Thousand/uL (ref 3.8–10.8)

## 2024-06-18 LAB — IRON,TIBC AND FERRITIN PANEL
%SAT: 21 % (ref 16–45)
Ferritin: 48 ng/mL (ref 16–288)
Iron: 67 ug/dL (ref 45–160)
TIBC: 312 ug/dL (ref 250–450)

## 2024-06-18 LAB — TSH: TSH: 2.95 m[IU]/L (ref 0.40–4.50)

## 2024-06-18 LAB — VITAMIN B12: Vitamin B-12: 467 pg/mL (ref 200–1100)

## 2024-06-18 NOTE — Progress Notes (Signed)
 Patient Office Visit  Assessment & Plan:  Mixed hyperlipidemia -     CBC with Differential/Platelet -     COMPLETE METABOLIC PANEL WITHOUT GFR -     Lipid panel -     TSH -     Vitamin B12 -     Iron, TIBC and Ferritin Panel  Hypothyroidism, unspecified type -     CBC with Differential/Platelet -     COMPLETE METABOLIC PANEL WITHOUT GFR -     Lipid panel -     TSH -     Vitamin B12 -     Iron, TIBC and Ferritin Panel  Malnutrition of moderate degree -     CBC with Differential/Platelet -     COMPLETE METABOLIC PANEL WITHOUT GFR -     Lipid panel -     TSH -     Vitamin B12 -     Iron, TIBC and Ferritin Panel  Numbness and tingling of right leg -     CBC with Differential/Platelet -     COMPLETE METABOLIC PANEL WITHOUT GFR -     TSH -     Vitamin B12 -     Iron, TIBC and Ferritin Panel  Palpitations -     CBC with Differential/Platelet -     COMPLETE METABOLIC PANEL WITHOUT GFR -     TSH  Current moderate episode of major depressive disorder, unspecified whether recurrent (HCC) -     CBC with Differential/Platelet -     COMPLETE METABOLIC PANEL WITHOUT GFR  Other insomnia -     TSH  Right bundle branch block  Essential hypertension -     CBC with Differential/Platelet -     COMPLETE METABOLIC PANEL WITHOUT GFR   Assessment and Plan    right Leg and back pain- Chronic pain likely due to previous patella fracture and compensatory use. Consider neuropathy and arthritis. - Discussed starting gabapentin 100 mg but patient declines at this time due to negative side effects. - Monitor for dizziness, adjust dosage as needed.  Anxiety Increased anxiety due to life changes. Current sertraline  dose may be insufficient. - Increase sertraline  to 150 mg. - Use pill box for medication adherence.  Palpitations with history of RBBB Intermittent palpitations possibly linked to anxiety, not impacting daily activities. - Monitor palpitations, consider cardiology  referral if symptoms persist or worsen.  General Health Maintenance Discussed individualized cancer screening decisions, especially for breast cancer. patient not sure she wants to do a mammogram at this time.     Recommend healthy diet i.e mediterranean/DASH diet, consistent exercise - 30 minutes 5 day per week, and recommend weight gain.  Discussed starting Gabapentin but patient declined for now.  We can discuss DEXA scan next time.  RTC 4-6 mos or sooner if nec. Patient will let me know how she is doing with the increased dosage of Zoloft .  Return in about 6 months (around 12/19/2024), or if symptoms worsen or fail to improve, for hypertension, hyperlipidemia, type 2 diabetes, depression.   Subjective:    Patient ID: Leslie Fuller, female    DOB: 12/20/1940  Age: 83 y.o. MRN: [id number]  Chief Complaint  Patient presents with   Medical Management of Chronic Issues    Pt c/o of R sided leg pain.     HPI Discussed the use of AI scribe software for clinical note transcription with the patient, who gave verbal consent to proceed.  History of Present Illness  Leslie Fuller is an 83 year old female with a history of right leg and hip/back pain, previous left patella fracture, anxiety, and palpitations who presents with neuropathic symptoms in her right leg. patient is also here to follow up on chronic medical issues.   She experiences persistent numbness and tingling in her right leg and back during the day, which does not disturb her sleep. A previous left patella fracture has led her to rely more on her other leg. She occasionally uses natural gel for relief, which provides some benefit.  She experiences palpitations primarily in the morning, lasting a few seconds, and finds relief by drinking water. She is cautious in the morning and avoids strenuous activities until later in the day. patient did see cardiology in [place] and had negative evaluation. Not having chest pain or SOB.  Patient has been under more stress since husband diagnosed with Lymphoma.   She is currently taking sertraline  100mg  and discussed with her doctor the possibility of increasing the dose to 150 mg due to heightened anxiety related to recent life changes, including moving to live with daughter and increased care taking responsibilities. She also takes medication to aid her sleep, which she finds essential. She feels responsible for managing everything at home.  No waking up at night due to leg pain. Reports morning palpitations lasting seconds, relieved by drinking water. Patient has not had any presyncope or syncope.  Physical Exam Results Assessment & Plan right Leg and back pain- Chronic pain likely due to previous patella fracture and compensatory use. Consider neuropathy and arthritis. - Discussed starting gabapentin 100 mg but patient declines at this time due to negative side effects. - Monitor for dizziness, adjust dosage as needed. Hyperlipidemia-denies unusual muscle aches or muscle cramps or difficulty tolerating statin therapy.  Aware of need for diet control, exercise and healthy eating.   HTN-using antihypertensive medication without difficulty.  Denies associated signs and symptoms including chest pain, shortness of breath, cough headache, peripheral swelling cramps spasms. Patient stays active.   Anxiety Increased anxiety due to life changes and care taking duties. Current sertraline  dose may be insufficient. Patient has not been able to gain weight.  - Increase sertraline  to 150 mg. - Use pill box for medication adherence.  Palpitations with history of RBBB Intermittent palpitations possibly linked to anxiety, not impacting daily activities. Patient does not want to see cardiology at this time. Increasing cardiazem not indicated due to well controlled blood pressure.  - Monitor palpitations, consider cardiology referral if symptoms persist or worsen.  General Health  Maintenance Discussed individualized cancer screening decisions, especially for breast cancer. patient not sure she wants to do a mammogram at this time.     The ASCVD Risk score (Arnett DK, et al., 2019) failed to calculate for the following reasons:   The 2019 ASCVD risk score is only valid for ages 66 to 50  Past Medical History:  Diagnosis Date   Anxiety    Hypertension    Thyroid  disease    Past Surgical History:  Procedure Laterality Date   ABDOMINAL HYSTERECTOMY     INGUINAL HERNIA REPAIR Right 07/29/2019   Procedure: RIGHT FEMORAL HERNIA REPAIR WITH MESH;  Surgeon: Curvin Deward MOULD, MD;  Location: [facility];  Service: General;  Laterality: Right;   THYROIDECTOMY     Social History   Tobacco Use   Smoking status: Never   Smokeless tobacco: Never  Substance Use Topics   Alcohol use: Never   Drug use:  Never   Family History  Problem Relation Age of Onset   Heart attack Father    No Known Allergies  ROS    Objective:    BP 110/78   Pulse 66   Temp 97.8 F (36.6 C)   Ht 5' (1.524 m)   Wt 85 lb 4 oz (38.7 kg)   SpO2 98%   BMI 16.65 kg/m  BP Readings from Last 3 Encounters:  06/18/24 110/78  02/12/24 110/68  01/15/24 (!) 150/70   Wt Readings from Last 3 Encounters:  06/18/24 85 lb 4 oz (38.7 kg)  02/12/24 85 lb 8 oz (38.8 kg)  01/15/24 87 lb 9.6 oz (39.7 kg)    Physical Exam Vitals and nursing note reviewed.  Constitutional:      General: She is not in acute distress.    Appearance: Normal appearance.     Comments: Comes in with her daughter and husband.   HENT:     Head: Normocephalic.     Right Ear: Tympanic membrane, ear canal and external ear normal.     Left Ear: Tympanic membrane, ear canal and external ear normal.  Eyes:     Extraocular Movements: Extraocular movements intact.     Conjunctiva/sclera: Conjunctivae normal.     Pupils: Pupils are equal, round, and reactive to light.  Cardiovascular:     Rate and Rhythm: Normal rate and  regular rhythm.     Heart sounds: Normal heart sounds.  Pulmonary:     Effort: Pulmonary effort is normal.     Breath sounds: Normal breath sounds.  Genitourinary:    Comments: No hernia appreciated right groin area.  Musculoskeletal:     Lumbar back: Tenderness and bony tenderness present.     Right hip: Tenderness and bony tenderness present. Normal range of motion.     Right lower leg: No edema.     Left lower leg: No edema.  Neurological:     General: No focal deficit present.     Mental Status: She is alert and oriented to person, place, and time.  Psychiatric:        Attention and Perception: Attention normal.        Mood and Affect: Mood is depressed. Affect is tearful.        Speech: Speech normal.        Behavior: Behavior normal.        Thought Content: Thought content normal.        Cognition and Memory: Cognition normal.        Judgment: Judgment normal.      No results found for any visits on 06/18/24.

## 2024-06-19 ENCOUNTER — Ambulatory Visit: Payer: Self-pay | Admitting: Family Medicine

## 2024-06-19 ENCOUNTER — Telehealth: Payer: Self-pay

## 2024-06-19 NOTE — Telephone Encounter (Signed)
 Copied from CRM [id number]. Topic: Clinical - Lab/Test Results >> Jun 19, 2024 10:15 AM Larissa RAMAN wrote: Reason for CRM: Patient's daughter, [name] Bollard, calling to obtain lab results. Relayed results per provider's note, verbatim. Verbalized understanding, no additional questions

## 2024-06-19 NOTE — Progress Notes (Signed)
 See note from [facility] nurse.

## 2024-06-19 NOTE — Progress Notes (Signed)
Left vm for pt daughter to return call

## 2024-08-07 ENCOUNTER — Other Ambulatory Visit: Payer: Self-pay | Admitting: Family Medicine

## 2024-08-07 DIAGNOSIS — W57XXXA Bitten or stung by nonvenomous insect and other nonvenomous arthropods, initial encounter: Secondary | ICD-10-CM

## 2024-08-07 MED ORDER — DOXYCYCLINE HYCLATE 100 MG PO TABS: 100.0000 mg | ORAL_TABLET | Freq: Two times a day (BID) | ORAL | 0 refills | Status: AC

## 2024-08-07 NOTE — Progress Notes (Signed)
 Patient was bit by a tick yesterday, has redness over pelvic area.

## 2024-09-09 ENCOUNTER — Other Ambulatory Visit: Payer: Self-pay | Admitting: Family Medicine

## 2024-09-09 MED ORDER — LEVOTHYROXINE SODIUM 50 MCG PO TABS: 50.0000 ug | ORAL_TABLET | ORAL | 3 refills | Status: AC

## 2024-09-09 NOTE — Progress Notes (Signed)
 Sent thyroid  medication to pharmacy [facility]

## 2024-09-10 ENCOUNTER — Ambulatory Visit (INDEPENDENT_AMBULATORY_CARE_PROVIDER_SITE_OTHER)

## 2024-09-10 VITALS — Ht 60.0 in | Wt 85.0 lb

## 2024-09-10 DIAGNOSIS — Z0000 Encounter for general adult medical examination without abnormal findings: Secondary | ICD-10-CM | POA: Diagnosis not present

## 2024-09-10 NOTE — Patient Instructions (Addendum)
 Leslie Fuller,  Thank you for taking the time for your Medicare Wellness Visit. I appreciate your continued commitment to your health goals. Please review the care plan we discussed, and feel free to reach out if I can assist you further.  Medicare recommends these wellness visits once per year to help you and your care team stay ahead of potential health issues. These visits are designed to focus on prevention, allowing your provider to concentrate on managing your acute and chronic conditions during your regular appointments.  Please note that Annual Wellness Visits do not include a physical exam. Some assessments may be limited, especially if the visit was conducted virtually. If needed, we may recommend a separate in-person follow-up with your provider.  Ongoing Care Seeing your primary care provider every 3 to 6 months helps us  monitor your health and provide consistent, personalized care.   Referrals If a referral was made during today's visit and you haven't received any updates within two weeks, please contact the referred provider directly to check on the status.  Recommended Screenings:  Health Maintenance  Topic Date Due   Flu Shot  07/04/2024   COVID-19 Vaccine (5 - 2025-26 season) 08/04/2024   Medicare Annual Wellness Visit  09/10/2025   DTaP/Tdap/Td vaccine (2 - Td or Tdap) 11/29/2032   Pneumococcal Vaccine for age over 105  Completed   DEXA scan (bone density measurement)  Completed   Zoster (Shingles) Vaccine  Completed   Meningitis B Vaccine  Aged Out       09/10/2024    2:39 PM  Advanced Directives  Does Patient Have a Medical Advance Directive? No  Would patient like information on creating a medical advance directive? Yes (MAU/Ambulatory/Procedural Areas - Information given)   Advance Care Planning is important because it: Ensures you receive medical care that aligns with your values, goals, and preferences. Provides guidance to your family and loved ones,  reducing the emotional burden of decision-making during critical moments.  Information on Advanced Care Planning can be found at [place]  Secretary of [facility] Advance Health Care Directives Advance Health Care Directives ([email or web address]   Vision: Annual vision screenings are recommended for early detection of glaucoma, cataracts, and diabetic retinopathy. These exams can also reveal signs of chronic conditions such as diabetes and high blood pressure.  Dental: Annual dental screenings help detect early signs of oral cancer, gum disease, and other conditions linked to overall health, including heart disease and diabetes.  Please see the attached documents for additional preventive care recommendations.

## 2024-09-10 NOTE — Progress Notes (Signed)
 Subjective:   Leslie Fuller is a 83 y.o. who presents for a Medicare Wellness preventive visit.  As a reminder, Annual Wellness Visits don't include a physical exam, and some assessments may be limited, especially if this visit is performed virtually. We may recommend an in-person follow-up visit with your provider if needed.  Visit Complete: Virtual I connected with  Leslie Fuller on 09/10/24 by a audio enabled telemedicine application and verified that I am speaking with the correct person using two identifiers.  Patient Location: Home  Provider Location: Home Office  I discussed the limitations of evaluation and management by telemedicine. The patient expressed understanding and agreed to proceed.  Vital Signs: Because this visit was a virtual/telehealth visit, some criteria may be missing or patient reported. Any vitals not documented were not able to be obtained and vitals that have been documented are patient reported.  VideoDeclined- This patient declined Librarian, academic. Therefore the visit was completed with audio only.  Persons Participating in Visit: Patient assisted by daughter Leslie Fuller .  AWV Questionnaire: No: Patient Medicare AWV questionnaire was not completed prior to this visit.  Cardiac Risk Factors include: advanced age (>32men, >3 women);hypertension;dyslipidemia     Objective:    Today's Vitals   09/10/24 1431  Weight: 85 lb (38.6 kg)  Height: 5' (1.524 m)   Body mass index is 16.6 kg/m.     09/10/2024    2:39 PM 11/29/2022    7:44 AM 04/21/2020   12:54 PM 07/31/2019   11:00 AM 07/29/2019    1:30 AM  Advanced Directives  Does Patient Have a Medical Advance Directive? No No No Yes Yes  Type of Agricultural consultant;Living will  Does patient want to make changes to medical advance directive?     No - Patient declined  Copy of Healthcare Power of Attorney in Chart?     No - copy requested  Would  patient like information on creating a medical advance directive? Yes (MAU/Ambulatory/Procedural Areas - Information given) No - Patient declined       Current Medications (verified) Outpatient Encounter Medications as of 09/10/2024  Medication Sig   acetaminophen  (TYLENOL ) 500 MG tablet You can take 1000 mg every 8 hours as needed for pain.  You can also alternate plain Tylenol /acetominophin with ibuprofen . Start the ibuprofen  about 2 hours after you take the Tylenol  for additional pain relief.  You can buy this over the counter at any drug store.   calcium-vitamin D (OSCAL WITH D) 500-200 MG-UNIT tablet Take 1 tablet by mouth daily with breakfast.   diltiazem  (CARDIZEM  CD) 240 MG 24 hr capsule Take by mouth.   levothyroxine  (SYNTHROID ) 50 MCG tablet Take 1 tablet (50 mcg total) by mouth See admin instructions. Take one tablet daily.   LORazepam  (ATIVAN ) 1 MG tablet Take 1 tablet (1 mg total) by mouth at bedtime.   pravastatin (PRAVACHOL) 10 MG tablet Take 10 mg by mouth at bedtime.    sertraline  (ZOLOFT ) 100 MG tablet 1.5 tablets per day.   No facility-administered encounter medications on file as of 09/10/2024.    Allergies (verified) Patient has no known allergies.   History: Past Medical History:  Diagnosis Date   Anxiety    Hypertension    Thyroid  disease    Past Surgical History:  Procedure Laterality Date   ABDOMINAL HYSTERECTOMY     INGUINAL HERNIA REPAIR Right 07/29/2019   Procedure: RIGHT FEMORAL HERNIA REPAIR WITH  MESH;  Surgeon: Leslie Deward MOULD, MD;  Location: [facility];  Service: General;  Laterality: Right;   THYROIDECTOMY     Family History  Problem Relation Age of Onset   Heart attack Father    Social History   Socioeconomic History   Marital status: Married    Spouse name: Not on file   Number of children: Not on file   Years of education: Not on file   Highest education level: Not on file  Occupational History   Not on file  Tobacco Use   Smoking status:  Never   Smokeless tobacco: Never  Substance and Sexual Activity   Alcohol use: Never   Drug use: Never   Sexual activity: Not on file  Other Topics Concern   Not on file  Social History Narrative   Not on file   Social Drivers of Health   Financial Resource Strain: Patient Declined (09/10/2024)   Overall Financial Resource Strain (CARDIA)    Difficulty of Paying Living Expenses: Patient declined  Food Insecurity: No Food Insecurity (09/10/2024)   Hunger Vital Sign    Worried About Running Out of Food in the Last Year: Never true    Ran Out of Food in the Last Year: Never true  Transportation Needs: No Transportation Needs (09/10/2024)   PRAPARE - Administrator, Civil Service (Medical): No    Lack of Transportation (Non-Medical): No  Physical Activity: Patient Declined (09/10/2024)   Exercise Vital Sign    Days of Exercise per Week: Patient declined    Minutes of Exercise per Session: Patient declined  Stress: Stress Concern Present (09/10/2024)   [employer] of Occupational Health - Occupational Stress Questionnaire    Feeling of Stress: To some extent  Social Connections: Unknown (09/10/2024)   Social Connection and Isolation Panel    Frequency of Communication with Friends and Family: More than three times a week    Frequency of Social Gatherings with Friends and Family: Three times a week    Attends Religious Services: More than 4 times per year    Active Member of Clubs or Organizations: Patient declined    Attends Banker Meetings: Patient declined    Marital Status: Married    Tobacco Counseling Counseling given: Not Answered    Clinical Intake:  Pre-visit preparation completed: Yes  Pain : No/denies pain  Diabetes: No  How often do you need to have someone help you when you read instructions, pamphlets, or other written materials from your doctor or pharmacy?: 1 - Never  Interpreter Needed?: No  Information entered by ::  Charmaine Bloodgood LPN   Activities of Daily Living     09/10/2024    2:39 PM  In your present state of health, do you have any difficulty performing the following activities:  Hearing? 0  Vision? 0  Difficulty concentrating or making decisions? 0  Walking or climbing stairs? 0  Dressing or bathing? 0  Doing errands, shopping? 1  Preparing Food and eating ? N  Using the Toilet? N  In the past six months, have you accidently leaked urine? N  Do you have problems with loss of bowel control? N  Managing your Medications? Y  Managing your Finances? Y  Housekeeping or managing your Housekeeping? N    Patient Care Team: Aletha Bene, MD as PCP - General (Family Medicine)  I have updated your Care Teams any recent Medical Services you may have received from other providers in the past  year.     Assessment:   This is a routine wellness examination for [facility].  Hearing/Vision screen Hearing Screening - Comments:: Denies hearing difficulties   Vision Screening - Comments:: Wears rx glasses - up to date with routine eye exams    Goals Addressed             This Visit's Progress    Maintain health   On track      Depression Screen     09/10/2024    2:37 PM 06/18/2024    8:27 AM 02/12/2024    9:44 AM  PHQ 2/9 Scores  PHQ - 2 Score 2 2 2   PHQ- 9 Score 6 7 5     Fall Risk     09/10/2024    2:39 PM 06/18/2024    8:27 AM  Fall Risk   Falls in the past year? 0 0  Number falls in past yr: 0 0  Injury with Fall? 0 0  Risk for fall due to : No Fall Risks   Follow up Falls prevention discussed;Education provided;Falls evaluation completed     MEDICARE RISK AT HOME:  Medicare Risk at Home Any stairs in or around the home?: No If so, are there any without handrails?: No Home free of loose throw rugs in walkways, pet beds, electrical cords, etc?: Yes Adequate lighting in your home to reduce risk of falls?: Yes Life alert?: No Use of a cane, walker or w/c?: No Grab bars in  the bathroom?: Yes Shower chair or bench in shower?: No Elevated toilet seat or a handicapped toilet?: Yes  TIMED UP AND GO:  Was the test performed?  No  Cognitive Function: Unable: Due to language barrier, hearing or vision limitations or other         Immunizations Immunization History  Administered Date(s) Administered   Fluad Quad(high Dose 65+) 08/30/2019, 10/28/2022   Fluzone Influenza virus vaccine,trivalent (IIV3), split virus 09/27/2009   INFLUENZA, HIGH DOSE SEASONAL PF 10/06/2015, 09/23/2018, 10/05/2020, 09/22/2021, 09/28/2023   Influenza, Seasonal, Injecte, Preservative Fre 08/30/2010, 09/11/2011, 09/09/2012   Influenza,inj,Quad PF,6+ Mos 09/09/2013, 08/27/2014, 09/13/2016, 09/18/2017   Influenza-Unspecified 10/06/2015   PFIZER(Purple Top)SARS-COV-2 Vaccination 12/26/2019, 01/16/2020, 09/25/2020, 11/24/2020   Pneumococcal Conjugate-13 05/30/2017   Pneumococcal Polysaccharide-23 10/06/2015, 09/22/2021   Tdap 11/29/2022   Zoster Recombinant(Shingrix) 03/28/2022, 10/11/2022    Screening Tests Health Maintenance  Topic Date Due   Influenza Vaccine  07/04/2024   COVID-19 Vaccine (5 - 2025-26 season) 08/04/2024   Medicare Annual Wellness (AWV)  09/10/2025   DTaP/Tdap/Td (2 - Td or Tdap) 11/29/2032   Pneumococcal Vaccine: 50+ Years  Completed   DEXA SCAN  Completed   Zoster Vaccines- Shingrix  Completed   Meningococcal B Vaccine  Aged Out    Health Maintenance Items Addressed: Vaccines Due: Flu  Additional Screening:  Vision Screening: Recommended annual ophthalmology exams for early detection of glaucoma and other disorders of the eye. Is the patient up to date with their annual eye exam?  Yes  Who is the provider or what is the name of the office in which the patient attends annual eye exams? Dr. Madelyn   Dental Screening: Recommended annual dental exams for proper oral hygiene  Community Resource Referral / Chronic Care Management: CRR required this visit?   No   CCM required this visit?  No   Plan:    I have personally reviewed and noted the following in the patient's chart:   Medical and social history Use of alcohol, tobacco  or illicit drugs  Current medications and supplements including opioid prescriptions. Patient is not currently taking opioid prescriptions. Functional ability and status Nutritional status Physical activity Advanced directives List of other physicians Hospitalizations, surgeries, and ER visits in previous 12 months Vitals Screenings to include cognitive, depression, and falls Referrals and appointments  In addition, I have reviewed and discussed with patient certain preventive protocols, quality metrics, and best practice recommendations. A written personalized care plan for preventive services as well as general preventive health recommendations were provided to patient.   Lavelle Pfeiffer [place], [place]   89/12/7972   After Visit Summary: (MyChart) Due to this being a telephonic visit, the after visit summary with patients personalized plan was offered to patient via MyChart   Notes: Nothing significant to report at this time.

## 2024-09-29 ENCOUNTER — Ambulatory Visit

## 2024-10-20 ENCOUNTER — Ambulatory Visit: Admitting: Family Medicine

## 2024-10-31 ENCOUNTER — Other Ambulatory Visit: Payer: Self-pay | Admitting: Family Medicine

## 2024-10-31 DIAGNOSIS — F321 Major depressive disorder, single episode, moderate: Secondary | ICD-10-CM

## 2024-11-03 ENCOUNTER — Other Ambulatory Visit: Payer: Self-pay | Admitting: Family Medicine

## 2024-11-03 DIAGNOSIS — G4709 Other insomnia: Secondary | ICD-10-CM

## 2024-11-03 MED ORDER — LORAZEPAM 1 MG PO TABS: 1.0000 mg | ORAL_TABLET | Freq: Every day | ORAL | 1 refills | Status: AC

## 2024-11-03 NOTE — Progress Notes (Signed)
 Needs refill of her sleep medication. Was send to her pharmacy today.

## 2024-11-12 ENCOUNTER — Ambulatory Visit: Admitting: Family Medicine

## 2024-11-13 ENCOUNTER — Other Ambulatory Visit: Payer: Self-pay | Admitting: Family Medicine

## 2024-11-13 MED ORDER — PRAVASTATIN SODIUM 10 MG PO TABS: 10.0000 mg | ORAL_TABLET | Freq: Every day | ORAL | 3 refills | Status: AC

## 2024-11-13 NOTE — Progress Notes (Signed)
 Needs refills of pravachol- sent to pharmacy

## 2024-11-17 ENCOUNTER — Ambulatory Visit: Admitting: Family Medicine

## 2024-11-17 ENCOUNTER — Encounter: Payer: Self-pay | Admitting: Family Medicine

## 2024-11-17 VITALS — BP 124/84 | HR 67 | Temp 98.4°F | Ht 60.0 in | Wt 87.5 lb

## 2024-11-17 DIAGNOSIS — E785 Hyperlipidemia, unspecified: Secondary | ICD-10-CM

## 2024-11-17 DIAGNOSIS — G4709 Other insomnia: Secondary | ICD-10-CM

## 2024-11-17 DIAGNOSIS — R002 Palpitations: Secondary | ICD-10-CM

## 2024-11-17 DIAGNOSIS — I10 Essential (primary) hypertension: Secondary | ICD-10-CM

## 2024-11-17 DIAGNOSIS — F321 Major depressive disorder, single episode, moderate: Secondary | ICD-10-CM

## 2024-11-17 DIAGNOSIS — E039 Hypothyroidism, unspecified: Secondary | ICD-10-CM

## 2024-11-17 MED ORDER — DILTIAZEM HCL ER COATED BEADS 240 MG PO CP24: 240.0000 mg | ORAL_CAPSULE | Freq: Every day | ORAL | 1 refills | Status: AC

## 2024-11-17 NOTE — Progress Notes (Signed)
 Patient Office Visit  Assessment & Plan:  Essential hypertension -     dilTIAZem  HCl ER Coated Beads; Take 1 capsule (240 mg total) by mouth daily.  Dispense: 90 capsule; Refill: 1 -     CBC with Differential/Platelet -     Comprehensive metabolic panel with GFR  Hypothyroidism, unspecified type -     TSH -     Vitamin B12  Current moderate episode of major depressive disorder, unspecified whether recurrent (HCC) -     Vitamin B12  Hyperlipidemia, unspecified hyperlipidemia type -     Lipid panel  Other insomnia  Palpitations -     CBC with Differential/Platelet -     TSH -     Comprehensive metabolic panel with GFR -     Vitamin B6 -     Magnesium    Assessment and Plan    Major depressive disorder, single episode, moderate Zoloft  150 mg daily is partially effective. Prefers current dosage. - Continue Zoloft  150 mg daily. Insomnia- patient has been taking Ativan  for over 20 years and is still effective. Patient cannot stop this medication Hypothyroidism- stable on current dosage.   Hyperlipidemia Cholesterol levels pending routine blood work. patient taking Pravachol .   Palpitations Morning palpitations possibly stress or malnutrition-related. Cardizem  effective. - Continue Cardizem . - Ordered blood work for vitamin deficiencies, including B12 and magnesium .  General Health Maintenance Flu shot received. Mammogram deferred by choice.          Return in about 4 months (around 03/18/2025), or if symptoms worsen or fail to improve.   Subjective:    Patient ID: Leslie Fuller, female    DOB: December 31, 1940  Age: 83 y.o. MRN: [id number]  Chief Complaint  Patient presents with   Medical Management of Chronic Issues    HPI Discussed the use of AI scribe software for clinical note transcription with the patient, who gave verbal consent to proceed.  History of Present Illness        History of Present Illness Leslie Fuller is an 83 year old female who presents  for medication management and evaluation of palpitations.  She has ongoing issues with anxiety and depression, currently managed with Zoloft  at a dose of 150 mg daily, which she feels is effective. She avoids communication with people in [place] as it exacerbates her anxiety. Her husband has lymphoma and is currently at [facility] with sepsis.   She experiences palpitations, which are more pronounced in the morning and when climbing stairs, and she mentions stress as a possible factor. She is on Diltiazem , which she finds more effective than previous medications. Palpitations are more pronounced in the morning, especially when climbing stairs, necessitating rest to recover.  She has a history of low weight and malnutrition, often noted during hospital visits. She struggles to gain weight, consuming less protein like meat and chicken, and is fatigued, attributing this to her age.  She has not had a mammogram this year due to being busy and other health issues.  Daughter was concerned about the addiction potential of gabapentin so patient did not want to start taking Gabapentin for leg pain.  She currently uses magnesium  cream to alleviate sensations of heat and burning of her leg which she finds helpful.  Results Labs Vitamin B12 (06/2024): Within normal limits  Assessment and Plan Major depressive disorder, single episode, moderate Zoloft  150 mg daily is partially effective. Prefers current dosage. - Continue Zoloft  150 mg daily. Insomnia- patient has been taking Ativan  for over  20 years and is still effective. Patient cannot stop this medication Hypothyroidism- stable on current dosage.   Hyperlipidemia Cholesterol levels pending routine blood work. patient taking Pravachol .   Palpitations Morning palpitations possibly stress or malnutrition-related. Cardizem  effective. - Continue Cardizem . - Ordered blood work for vitamin deficiencies, including B12 and magnesium .  General  Health Maintenance Flu shot received. Mammogram deferred by choice.   The ASCVD Risk score (Arnett DK, et al., 2019) failed to calculate for the following reasons:   The 2019 ASCVD risk score is only valid for ages 31 to 74   * - Cholesterol units were assumed  Past Medical History:  Diagnosis Date   Anxiety    Hypertension    Thyroid  disease    Past Surgical History:  Procedure Laterality Date   ABDOMINAL HYSTERECTOMY     INGUINAL HERNIA REPAIR Right 07/29/2019   Procedure: RIGHT FEMORAL HERNIA REPAIR WITH MESH;  Surgeon: Curvin Deward MOULD, MD;  Location: [facility];  Service: General;  Laterality: Right;   THYROIDECTOMY     Social History[1] Family History  Problem Relation Age of Onset   Heart attack Father    Allergies[2]  ROS    Objective:    BP 124/84   Pulse 67   Temp 98.4 F (36.9 C)   Ht 5' (1.524 m)   Wt 87 lb 8 oz (39.7 kg)   SpO2 98%   BMI 17.09 kg/m  BP Readings from Last 3 Encounters:  11/17/24 124/84  06/18/24 110/78  02/12/24 110/68   Wt Readings from Last 3 Encounters:  11/17/24 87 lb 8 oz (39.7 kg)  09/10/24 85 lb (38.6 kg)  06/18/24 85 lb 4 oz (38.7 kg)    Physical Exam Vitals and nursing note reviewed.  Constitutional:      General: She is not in acute distress.    Appearance: Normal appearance.     Comments: Comes in with her daughter [name]  HENT:     Head: Normocephalic.     Right Ear: Tympanic membrane, ear canal and external ear normal.     Left Ear: Tympanic membrane, ear canal and external ear normal.  Eyes:     Extraocular Movements: Extraocular movements intact.     Pupils: Pupils are equal, round, and reactive to light.  Cardiovascular:     Rate and Rhythm: Normal rate and regular rhythm.     Heart sounds: Normal heart sounds.  Pulmonary:     Effort: Pulmonary effort is normal.     Breath sounds: Normal breath sounds. No wheezing or rhonchi.  Musculoskeletal:     Right lower leg: No edema.     Left lower leg: No edema.   Neurological:     General: No focal deficit present.     Mental Status: She is alert and oriented to person, place, and time.  Psychiatric:        Mood and Affect: Mood normal.        Behavior: Behavior normal.        Thought Content: Thought content normal.        Judgment: Judgment normal.      No results found for any visits on 11/17/24.          [1]  Social History Tobacco Use   Smoking status: Never   Smokeless tobacco: Never  Substance Use Topics   Alcohol use: Never   Drug use: Never  [2] No Known Allergies

## 2024-11-18 ENCOUNTER — Ambulatory Visit: Payer: Self-pay | Admitting: Family Medicine

## 2024-11-18 LAB — COMPREHENSIVE METABOLIC PANEL WITH GFR
AG Ratio: 1.3 (calc) (ref 1.0–2.5)
ALT: 14 U/L (ref 6–29)
AST: 17 U/L (ref 10–35)
Albumin: 4.3 g/dL (ref 3.6–5.1)
Alkaline phosphatase (APISO): 98 U/L (ref 37–153)
BUN/Creatinine Ratio: 35 (calc) — ABNORMAL HIGH (ref 6–22)
BUN: 18 mg/dL (ref 7–25)
CO2: 31 mmol/L (ref 20–32)
Calcium: 9.7 mg/dL (ref 8.6–10.4)
Chloride: 101 mmol/L (ref 98–110)
Creat: 0.51 mg/dL — ABNORMAL LOW (ref 0.60–0.95)
Globulin: 3.4 g/dL (ref 1.9–3.7)
Glucose, Bld: 80 mg/dL (ref 65–99)
Potassium: 4.5 mmol/L (ref 3.5–5.3)
Sodium: 139 mmol/L (ref 135–146)
Total Bilirubin: 0.4 mg/dL (ref 0.2–1.2)
Total Protein: 7.7 g/dL (ref 6.1–8.1)
eGFR: 93 mL/min/1.73m2 (ref >=60)

## 2024-11-18 LAB — VITAMIN B12: Vitamin B-12: 376 pg/mL (ref 200–1100)

## 2024-11-18 LAB — CBC WITH DIFFERENTIAL/PLATELET
Absolute Lymphocytes: 990 {cells}/uL (ref 850–3900)
Absolute Monocytes: 321 {cells}/uL (ref 200–950)
Basophils Absolute: 31 {cells}/uL (ref 0–200)
Basophils Relative: 0.7 %
Eosinophils Absolute: 22 {cells}/uL (ref 15–500)
Eosinophils Relative: 0.5 %
HCT: 39.9 % (ref 35.9–46.0)
Hemoglobin: 12.8 g/dL (ref 11.7–15.5)
MCH: 28.8 pg (ref 27.0–33.0)
MCHC: 32.1 g/dL (ref 31.6–35.4)
MCV: 89.7 fL (ref 81.4–101.7)
MPV: 11.3 fL (ref 7.5–12.5)
Monocytes Relative: 7.3 %
Neutro Abs: 3036 {cells}/uL (ref 1500–7800)
Neutrophils Relative %: 69 %
Platelets: 266 Thousand/uL (ref 140–400)
RBC: 4.45 Million/uL (ref 3.80–5.10)
RDW: 12.9 % (ref 11.0–15.0)
Total Lymphocyte: 22.5 %
WBC: 4.4 Thousand/uL (ref 3.8–10.8)

## 2024-11-18 LAB — MAGNESIUM: Magnesium: 2.4 mg/dL (ref 1.5–2.5)

## 2024-11-18 LAB — LIPID PANEL
Cholesterol: 198 mg/dL (ref <200)
HDL: 69 mg/dL (ref >=50)
LDL Cholesterol (Calc): 112 mg/dL — ABNORMAL HIGH
Non-HDL Cholesterol (Calc): 129 mg/dL (ref <130)
Total CHOL/HDL Ratio: 2.9 (calc) (ref <5.0)
Triglycerides: 82 mg/dL (ref <150)

## 2024-11-18 LAB — TSH: TSH: 3.41 m[IU]/L (ref 0.40–4.50)

## 2024-11-20 LAB — VITAMIN B6: Vitamin B6: 8.4 ng/mL (ref 2.1–21.7)

## 2024-12-12 ENCOUNTER — Other Ambulatory Visit: Payer: Self-pay

## 2024-12-12 DIAGNOSIS — R55 Syncope and collapse: Secondary | ICD-10-CM

## 2024-12-12 DIAGNOSIS — R002 Palpitations: Secondary | ICD-10-CM

## 2025-03-18 ENCOUNTER — Ambulatory Visit: Admitting: Family Medicine

## 2025-09-16 ENCOUNTER — Ambulatory Visit
